# Patient Record
Sex: Male | Born: 1975 | Race: Black or African American | Hispanic: No | Marital: Married | Smoking: Current every day smoker
Health system: Southern US, Community
[De-identification: ages and names within clinical notes are randomized; demographics above are authoritative.]

## PROBLEM LIST (undated history)

## (undated) DIAGNOSIS — G4733 Obstructive sleep apnea (adult) (pediatric): Secondary | ICD-10-CM

## (undated) DIAGNOSIS — I10 Essential (primary) hypertension: Secondary | ICD-10-CM

## (undated) DIAGNOSIS — N200 Calculus of kidney: Secondary | ICD-10-CM

## (undated) DIAGNOSIS — R011 Cardiac murmur, unspecified: Secondary | ICD-10-CM

## (undated) HISTORY — DX: Calculus of kidney: N20.0

## (undated) HISTORY — DX: Cardiac murmur, unspecified: R01.1

## (undated) HISTORY — DX: Essential (primary) hypertension: I10

## (undated) HISTORY — DX: Obstructive sleep apnea (adult) (pediatric): G47.33

## (undated) HISTORY — PX: FRACTURE SURGERY: SHX138

---

## 2001-07-25 ENCOUNTER — Encounter: Payer: Self-pay | Admitting: Emergency Medicine

## 2001-07-25 ENCOUNTER — Emergency Department (HOSPITAL_COMMUNITY): Admission: EM | Admit: 2001-07-25 | Discharge: 2001-07-25 | Payer: Self-pay | Admitting: Emergency Medicine

## 2004-08-25 ENCOUNTER — Emergency Department (HOSPITAL_COMMUNITY): Admission: EM | Admit: 2004-08-25 | Discharge: 2004-08-25 | Payer: Self-pay | Admitting: Family Medicine

## 2004-09-24 ENCOUNTER — Emergency Department (HOSPITAL_COMMUNITY): Admission: EM | Admit: 2004-09-24 | Discharge: 2004-09-24 | Payer: Self-pay | Admitting: Emergency Medicine

## 2004-10-30 ENCOUNTER — Ambulatory Visit: Payer: Self-pay | Admitting: Internal Medicine

## 2004-12-01 ENCOUNTER — Ambulatory Visit: Payer: Self-pay | Admitting: Internal Medicine

## 2005-02-20 ENCOUNTER — Emergency Department (HOSPITAL_COMMUNITY): Admission: EM | Admit: 2005-02-20 | Discharge: 2005-02-20 | Payer: Self-pay | Admitting: Emergency Medicine

## 2005-12-29 ENCOUNTER — Emergency Department (HOSPITAL_COMMUNITY): Admission: EM | Admit: 2005-12-29 | Discharge: 2005-12-29 | Payer: Self-pay | Admitting: Family Medicine

## 2008-08-28 ENCOUNTER — Emergency Department (HOSPITAL_COMMUNITY): Admission: EM | Admit: 2008-08-28 | Discharge: 2008-08-28 | Payer: Self-pay | Admitting: Family Medicine

## 2009-04-15 ENCOUNTER — Emergency Department (HOSPITAL_BASED_OUTPATIENT_CLINIC_OR_DEPARTMENT_OTHER): Admission: EM | Admit: 2009-04-15 | Discharge: 2009-04-15 | Payer: Self-pay | Admitting: Emergency Medicine

## 2009-11-19 ENCOUNTER — Emergency Department (HOSPITAL_BASED_OUTPATIENT_CLINIC_OR_DEPARTMENT_OTHER): Admission: EM | Admit: 2009-11-19 | Discharge: 2009-11-19 | Payer: Self-pay | Admitting: Emergency Medicine

## 2009-11-19 ENCOUNTER — Ambulatory Visit: Payer: Self-pay | Admitting: Radiology

## 2010-07-18 LAB — URINE MICROSCOPIC-ADD ON

## 2010-07-18 LAB — URINALYSIS, ROUTINE W REFLEX MICROSCOPIC
Bilirubin Urine: NEGATIVE
Glucose, UA: NEGATIVE mg/dL
Ketones, ur: NEGATIVE mg/dL
Nitrite: NEGATIVE
Protein, ur: NEGATIVE mg/dL
Specific Gravity, Urine: 1.02 (ref 1.005–1.030)
Urobilinogen, UA: 0.2 mg/dL (ref 0.0–1.0)
pH: 6 (ref 5.0–8.0)

## 2010-08-12 LAB — STREP A DNA PROBE: Group A Strep Probe: NEGATIVE

## 2010-08-12 LAB — POCT RAPID STREP A (OFFICE): Streptococcus, Group A Screen (Direct): NEGATIVE

## 2010-08-14 ENCOUNTER — Emergency Department (HOSPITAL_BASED_OUTPATIENT_CLINIC_OR_DEPARTMENT_OTHER)
Admission: EM | Admit: 2010-08-14 | Discharge: 2010-08-14 | Disposition: A | Discharge status: Home / Self Care | Payer: BLUE CROSS/BLUE SHIELD | Attending: Emergency Medicine | Admitting: Emergency Medicine

## 2010-08-14 DIAGNOSIS — N200 Calculus of kidney: Secondary | ICD-10-CM | POA: Insufficient documentation

## 2010-08-14 DIAGNOSIS — R319 Hematuria, unspecified: Secondary | ICD-10-CM | POA: Insufficient documentation

## 2010-08-14 DIAGNOSIS — R109 Unspecified abdominal pain: Secondary | ICD-10-CM | POA: Insufficient documentation

## 2010-08-14 LAB — URINE MICROSCOPIC-ADD ON

## 2010-08-14 LAB — URINALYSIS, ROUTINE W REFLEX MICROSCOPIC
Bilirubin Urine: NEGATIVE
Glucose, UA: NEGATIVE mg/dL
Ketones, ur: 15 mg/dL — AB
Leukocytes, UA: NEGATIVE
Nitrite: NEGATIVE
Protein, ur: NEGATIVE mg/dL
Specific Gravity, Urine: 1.025 (ref 1.005–1.030)
Urobilinogen, UA: 0.2 mg/dL (ref 0.0–1.0)
pH: 6 (ref 5.0–8.0)

## 2013-10-18 ENCOUNTER — Ambulatory Visit (INDEPENDENT_AMBULATORY_CARE_PROVIDER_SITE_OTHER): Payer: BC Managed Care – PPO | Admitting: Family Medicine

## 2013-10-18 VITALS — BP 136/84 | HR 81 | Temp 99.0°F | Resp 20 | Ht 74.0 in | Wt 195.0 lb

## 2013-10-18 DIAGNOSIS — J029 Acute pharyngitis, unspecified: Secondary | ICD-10-CM

## 2013-10-18 LAB — POCT RAPID STREP A (OFFICE): Rapid Strep A Screen: NEGATIVE

## 2013-10-18 MED ORDER — AMOXICILLIN 875 MG PO TABS: 875.0000 mg | ORAL_TABLET | Freq: Two times a day (BID) | ORAL | Status: DC

## 2013-10-18 MED ORDER — HYDROCODONE-ACETAMINOPHEN 5-325 MG PO TABS: 1.0000 | ORAL_TABLET | ORAL | Status: DC | PRN

## 2013-10-18 NOTE — Progress Notes (Signed)
Subjective: 38 year old man who has had a terrible sore throat that started yesterday. He feels bad. He went to work today despite the pain. He has had a lot of strep in the past. He thinks may have had a little fever today.  Objective: TMs are normal. Throat erythematous with a lot of possible tonsils which are very swollen. Neck supple with anterior cervical nodes. Chest clear. Heart regular without murmurs. Temperature was minimally elevated at 99 here.  Assessment: Pharyngitis, suspicious for strep tonsillitis  Plan: Strep test and culture if needed  Results for orders placed in visit on 10/18/13  POCT RAPID STREP A (OFFICE)      Result Value Ref Range   Rapid Strep A Screen Negative  Negative

## 2013-10-18 NOTE — Patient Instructions (Signed)
Drink plenty of fluids and get enough rest  Take amoxicillin 875 one twice daily for 10 days  Take the hydrocodone pain pills every 4 hours if needed for severe pain  Take the ibuprofen 800 mg 3 times daily which will treat both pain and fever  Return if worse

## 2013-10-21 LAB — CULTURE, GROUP A STREP

## 2013-10-23 ENCOUNTER — Telehealth: Payer: Self-pay

## 2013-10-23 NOTE — Telephone Encounter (Signed)
After looking in patient's chart, called patient.  He said someone already called him back about his strep test.

## 2013-10-23 NOTE — Telephone Encounter (Signed)
PT STATES SOMEONE CALLED HIM AND HE DOESN'T KNOW WHY, DIDN'T LEAVE A MESSAGE PLEASE CALL [phone]

## 2013-12-03 ENCOUNTER — Ambulatory Visit (INDEPENDENT_AMBULATORY_CARE_PROVIDER_SITE_OTHER): Payer: BC Managed Care – PPO | Admitting: Emergency Medicine

## 2013-12-03 VITALS — BP 110/72 | HR 85 | Temp 97.6°F | Resp 16 | Ht 73.25 in | Wt 185.2 lb

## 2013-12-03 DIAGNOSIS — J36 Peritonsillar abscess: Secondary | ICD-10-CM

## 2013-12-03 DIAGNOSIS — J029 Acute pharyngitis, unspecified: Secondary | ICD-10-CM

## 2013-12-03 DIAGNOSIS — D72829 Elevated white blood cell count, unspecified: Secondary | ICD-10-CM

## 2013-12-03 LAB — POCT CBC
Granulocyte percent: 75 %G (ref 37–80)
HCT, POC: 45.1 % (ref 43.5–53.7)
Hemoglobin: 14.7 g/dL (ref 14.1–18.1)
Lymph, poc: 3.1 (ref 0.6–3.4)
MCH, POC: 29.9 pg (ref 27–31.2)
MCHC: 32.5 g/dL (ref 31.8–35.4)
MCV: 92 fL (ref 80–97)
MID (cbc): 13 — AB (ref 0–0.9)
MPV: 6.9 fL (ref 0–99.8)
POC Granulocyte: 13 — AB (ref 2–6.9)
POC LYMPH PERCENT: 18.2 %L (ref 10–50)
POC MID %: 6.8 %M (ref 0–12)
Platelet Count, POC: 342 10*3/uL (ref 142–424)
RBC: 4.9 M/uL (ref 4.69–6.13)
RDW, POC: 14.7 %
WBC: 17.3 10*3/uL — AB (ref 4.6–10.2)

## 2013-12-03 MED ORDER — AMOXICILLIN-POT CLAVULANATE 875-125 MG PO TABS: 1.0000 | ORAL_TABLET | Freq: Two times a day (BID) | ORAL | Status: DC

## 2013-12-03 MED ORDER — HYDROCODONE-ACETAMINOPHEN 7.5-325 MG/15ML PO SOLN: 10.0000 mL | Freq: Four times a day (QID) | ORAL | Status: DC | PRN

## 2013-12-03 NOTE — Progress Notes (Signed)
Subjective:    Patient ID: Eric Owens, male    DOB: 01-18-1976, 38 y.o.   MRN: [id number]  HPI Pt presents to clinic with return of his sore throat.  He feels like he is to blame because he only took about 3 days of his abx before he went out of town and forgot about it.  He throat was feeling better on the right side but about 4 days ago his throat starting hurting more now on the left.  He is able to swallow but his throat really hurts when he does not take ibuprofen.    Reviewed labs from las visit Results for orders placed in visit on 10/18/13  CULTURE, GROUP A STREP      Result Value Ref Range   Organism ID, Bacteria STREPTOCOCCUS BETA HEMOLYTIC NOT GROUP A       Review of Systems  Constitutional: Positive for fever. Negative for chills.  HENT: Positive for sore throat (on the left only). Negative for congestion.        Objective:   Physical Exam  Vitals reviewed. Constitutional: He is oriented to person, place, and time. He appears well-developed and well-nourished.  HENT:  Head: Normocephalic and atraumatic.  Right Ear: Hearing, tympanic membrane, external ear and ear canal normal.  Left Ear: Hearing, tympanic membrane, external ear and ear canal normal.  Nose: Nose normal.  Mouth/Throat: Oropharyngeal exudate, posterior oropharyngeal edema (ulceration on the left side of soft palate in middle of fluctuent area and TTP) and posterior oropharyngeal erythema present.  Cardiovascular: Normal rate, regular rhythm and normal heart sounds.   No murmur heard. Pulmonary/Chest: Effort normal and breath sounds normal. He has no wheezes.  Lymphadenopathy:       Head (right side): No tonsillar and no occipital adenopathy present.       Head (left side): No tonsillar and no occipital adenopathy present.    He has cervical adenopathy.       Right cervical: Superficial cervical (2+ and TTP) adenopathy present.       Left cervical: Superficial cervical (3+ and TTP) adenopathy  present.       Right: No inguinal adenopathy present.       Left: No inguinal adenopathy present.  Neurological: He is alert and oriented to person, place, and time.  Skin: Skin is warm and dry.  Psychiatric: He has a normal mood and affect. His behavior is normal. Judgment and thought content normal.   Results for orders placed in visit on 12/03/13  POCT CBC      Result Value Ref Range   WBC 17.3 (*) 4.6 - 10.2 K/uL   Lymph, poc 3.1  0.6 - 3.4   POC LYMPH PERCENT 18.2  10 - 50 %L   MID (cbc) 13.0 (*) 0 - 0.9   POC MID % 6.8  0 - 12 %M   POC Granulocyte 13.0 (*) 2 - 6.9   Granulocyte percent 75.0  37 - 80 %G   RBC 4.90  4.69 - 6.13 M/uL   Hemoglobin 14.7  14.1 - 18.1 g/dL   HCT, POC 40.945.1  81.143.5 - 53.7 %   MCV 92.0  80 - 97 fL   MCH, POC 29.9  27 - 31.2 pg   MCHC 32.5  31.8 - 35.4 g/dL   RDW, POC 91.414.7     Platelet Count, POC 342  142 - 424 K/uL   MPV 6.9  0 - 99.8 fL  Assessment & Plan:  Sore throat - Plan: POCT CBC, HYDROcodone-acetaminophen (HYCET) 7.5-325 mg/15 ml solution  Leukocytosis, unspecified - Plan: Ambulatory referral to ENT  Abscess, peritonsillar - Plan: Ambulatory referral to ENT, amoxicillin-clavulanate (AUGMENTIN) 875-125 MG per tablet  Due to elevated white count and ulceration making it appear that the abscess is ready to drain we will refer to ENT for possible I&D.    Patient Instructions  [facility] today at 2:40 pm ph# [phone] [address]    D/w Dr Edrick Kins PA-C  [facility] 12/03/2013 10:06 AM

## 2013-12-03 NOTE — Patient Instructions (Signed)
[facility] today at 2:40 pm ph# [phone] [address]

## 2014-09-17 ENCOUNTER — Ambulatory Visit (INDEPENDENT_AMBULATORY_CARE_PROVIDER_SITE_OTHER): Payer: BLUE CROSS/BLUE SHIELD | Admitting: Family

## 2014-09-17 ENCOUNTER — Encounter: Payer: Self-pay | Admitting: Family

## 2014-09-17 VITALS — BP 140/90 | HR 83 | Temp 97.9°F | Resp 18 | Ht 74.0 in | Wt 200.0 lb

## 2014-09-17 DIAGNOSIS — Z23 Encounter for immunization: Secondary | ICD-10-CM

## 2014-09-17 DIAGNOSIS — R0683 Snoring: Secondary | ICD-10-CM | POA: Diagnosis not present

## 2014-09-17 DIAGNOSIS — Z0000 Encounter for general adult medical examination without abnormal findings: Secondary | ICD-10-CM

## 2014-09-17 DIAGNOSIS — G4733 Obstructive sleep apnea (adult) (pediatric): Secondary | ICD-10-CM | POA: Insufficient documentation

## 2014-09-17 DIAGNOSIS — Z0001 Encounter for general adult medical examination with abnormal findings: Secondary | ICD-10-CM | POA: Insufficient documentation

## 2014-09-17 NOTE — Progress Notes (Signed)
Pre visit review using our clinic review tool, if applicable. No additional management support is needed unless otherwise documented below in the visit note. 

## 2014-09-17 NOTE — Assessment & Plan Note (Signed)
Symptoms of snoring and episodes of apnea consistent with potential sleep apnea. Schedule sleep study.

## 2014-09-17 NOTE — Progress Notes (Signed)
Subjective:    Patient ID: Eric Owens, male    DOB: 29-Jun-1975, 39 y.o.   MRN: [id number]  Chief Complaint  Patient presents with  . Establish Care    concerned about cholesterol,     HPI:  Eric Owens is a 39 y.o. male who presents today for an annual wellness visit.   1) Health Maintenance -   Diet - Averages 3 meals per day consisting of meats, fruits, vegetables; Caffeine intake is minimal  Exercise - 2-3x per week. Walks everyday at work.    2) Preventative Exams / Immunizations:  Dental -- Due for exam  Vision -- Due for exam   Health Maintenance  Topic Date Due  . HIV Screening  01/20/1993  . TETANUS/TDAP  01/20/1997  . INFLUENZA VACCINE  12/02/2014  Tetanus shot   There is no immunization history on file for this patient.   No Known Allergies   Outpatient Prescriptions Prior to Visit  Medication Sig Dispense Refill  . amoxicillin-clavulanate (AUGMENTIN) 875-125 MG per tablet Take 1 tablet by mouth 2 (two) times daily. 20 tablet 0  . HYDROcodone-acetaminophen (HYCET) 7.5-325 mg/15 ml solution Take 10 mLs by mouth every 6 (six) hours as needed for moderate pain. 120 mL 0  . ibuprofen (ADVIL,MOTRIN) 200 MG tablet Take 200 mg by mouth as needed.     No facility-administered medications prior to visit.    Past Medical History  Diagnosis Date  . Heart murmur     Childhood  . Kidney stones      Past Surgical History  Procedure Laterality Date  . Fracture surgery      Left Hand  . Fracture surgery      left hand     Family History  Problem Relation Age of Onset  . Heart disease Father   . Hypertension Father   . Hypertension Sister   . Heart disease Paternal Grandfather   . Hypertension Sister   . Hypertension Sister      History   Social History  . Marital Status: Married    Spouse Name: N/A  . Number of Children: N/A  . Years of Education: N/A   Occupational History  . Not on file.   Social History Main Topics  . Smoking  status: Current Every Day Smoker -- 1.00 packs/day for 10 years    Types: Cigarettes  . Smokeless tobacco: Never Used  . Alcohol Use: 1.2 - 1.8 oz/week    2-3 Shots of liquor per week  . Drug Use: No  . Sexual Activity: Not on file   Other Topics Concern  . Not on file   Social History Narrative   Review of Systems  Constitutional: Denies fever, chills, fatigue, or significant weight gain/loss. HENT: Head: Denies headache or neck pain Ears: Denies changes in hearing, ringing in ears, earache, drainage Nose: Denies discharge, stuffiness, itching, nosebleed, sinus pain Throat: Denies sore throat, hoarseness, dry mouth, sores, thrush Eyes: Denies loss/changes in vision, pain, redness, blurry/double vision, flashing lights Cardiovascular: Denies chest pain/discomfort, tightness, palpitations, shortness of breath with activity, difficulty lying down, swelling, sudden awakening with shortness of breath Respiratory: Denies shortness of breath, cough, sputum production, wheezing Positive for snoring which has been going on for several months. Indicates his wife states he stops breathing for periods of time.  Gastrointestinal: Denies dysphasia, heartburn, change in appetite, nausea, change in bowel habits, rectal bleeding, constipation, diarrhea, yellow skin or eyes Genitourinary: Denies frequency, urgency, burning/pain, blood in urine, incontinence, change  in urinary strength. Musculoskeletal: Denies muscle/joint pain, stiffness, back pain, redness or swelling of joints, trauma Skin: Denies rashes, lumps, itching, dryness, color changes, or hair/nail changes Neurological: Denies dizziness, fainting, seizures, weakness, numbness, tingling, tremor Psychiatric - Denies nervousness, stress, depression or memory loss Endocrine: Denies heat or cold intolerance, sweating, frequent urination, excessive thirst, changes in appetite Hematologic: Denies ease of bruising or bleeding     Objective:     BP 140/90 mmHg  Pulse 83  Temp(Src) 97.9 F (36.6 C) (Oral)  Resp 18  Ht 6\' 2"  (1.88 m)  Wt 200 lb (90.719 kg)  BMI 25.67 kg/m2  SpO2 97% Nursing note and vital signs reviewed.  Physical Exam  Constitutional: He is oriented to person, place, and time. He appears well-developed and well-nourished.  HENT:  Head: Normocephalic.  Right Ear: Hearing, tympanic membrane, external ear and ear canal normal.  Left Ear: Hearing, tympanic membrane, external ear and ear canal normal.  Nose: Nose normal.  Mouth/Throat: Uvula is midline, oropharynx is clear and moist and mucous membranes are normal.  Eyes: Conjunctivae and EOM are normal. Pupils are equal, round, and reactive to light.  Neck: Neck supple. No JVD present. No tracheal deviation present. No thyromegaly present.  Cardiovascular: Normal rate, regular rhythm, normal heart sounds and intact distal pulses.   Pulmonary/Chest: Effort normal and breath sounds normal.  Abdominal: Soft. Bowel sounds are normal. He exhibits no distension and no mass. There is no tenderness. There is no rebound and no guarding.  Musculoskeletal: Normal range of motion. He exhibits no edema or tenderness.  Lymphadenopathy:    He has no cervical adenopathy.  Neurological: He is alert and oriented to person, place, and time. He has normal reflexes. No cranial nerve deficit. He exhibits normal muscle tone. Coordination normal.  Skin: Skin is warm and dry.  Psychiatric: He has a normal mood and affect. His behavior is normal. Judgment and thought content normal.       Assessment & Plan:

## 2014-09-17 NOTE — Assessment & Plan Note (Signed)
1) Anticipatory Guidance: Discussed importance of wearing a seatbelt while driving and not texting while driving; changing batteries in smoke detector at least once annually; wearing suntan lotion when outside; eating a balanced and moderate diet; getting physical activity at least 30 minutes per day.  2) Immunizations / Screenings / Labs:  Tetanus updated today. All other immunizations are up-to-date per her recommendations. Patient is due for a dental and vision exam which he will schedule independently. All other screenings are up-to-date per recommendations. Obtain CBC, BMET, Lipid profile and TSH and fasting.   Overall well exam. Patient has cardiovascular risk factors including tobacco use and elevated blood pressure. Discussed tobacco cessation methods and risks and benefits to reach. Information provided and after summary. Patient will review the information and and creates a plan to execute. Blood pressure is elevated today and is the first reading of high blood pressure per patient. Continue to monitor. Continue other healthy lifestyle behaviors. Follow-up prevention exam in one year. Follow-up office visit pending lab work.

## 2014-09-17 NOTE — Progress Notes (Deleted)
   Subjective:    Patient ID: Eric Owens, male    DOB: 1975-08-12, 39 y.o.   MRN: [id number]  Chief Complaint  Patient presents with  . Establish Care    HPI:  Eric Owens is a 39 y.o. male with a PMH of *** who presents today ***   Review of Systems    Objective:    Ht 6\' 2"  (1.88 m) Nursing note and vital signs reviewed.  Physical Exam     Assessment & Plan:

## 2014-09-17 NOTE — Patient Instructions (Addendum)
Thank you for choosing [facility].  Summary/Instructions:  Please stop by the lab on the basement level of the building for your blood work. Your results will be released to MyChart (or called to you) after review, usually within 72 hours after test completion. If any changes need to be made, you will be notified at that same time.  Referrals have been made during this visit. You should expect to hear back from our schedulers in about 7-10 days in regards to establishing an appointment with the specialists we discussed.   If your symptoms worsen or fail to improve, please contact our office for further instruction, or in case of emergency go directly to the emergency room at the closest medical facility.      Health Maintenance A healthy lifestyle and preventative care can promote health and wellness.  Maintain regular health, dental, and eye exams.  Eat a healthy diet. Foods like vegetables, fruits, whole grains, low-fat dairy products, and lean protein foods contain the nutrients you need and are low in calories. Decrease your intake of foods high in solid fats, added sugars, and salt. Get information about a proper diet from your health care provider, if necessary.  Regular physical exercise is one of the most important things you can do for your health. Most adults should get at least 150 minutes of moderate-intensity exercise (any activity that increases your heart rate and causes you to sweat) each week. In addition, most adults need muscle-strengthening exercises on 2 or more days a week.   Maintain a healthy weight. The body mass index (BMI) is a screening tool to identify possible weight problems. It provides an estimate of body fat based on height and weight. Your health care provider can find your BMI and can help you achieve or maintain a healthy weight. For males 20 years and older:  A BMI below 18.5 is considered underweight.  A BMI of 18.5 to 24.9 is normal.  A BMI  of 25 to 29.9 is considered overweight.  A BMI of 30 and above is considered obese.  Maintain normal blood lipids and cholesterol by exercising and minimizing your intake of saturated fat. Eat a balanced diet with plenty of fruits and vegetables. Blood tests for lipids and cholesterol should begin at age 65 and be repeated every 5 years. If your lipid or cholesterol levels are high, you are over age 64, or you are at high risk for heart disease, you may need your cholesterol levels checked more frequently.Ongoing high lipid and cholesterol levels should be treated with medicines if diet and exercise are not working.  If you smoke, find out from your health care provider how to quit. If you do not use tobacco, do not start.  Lung cancer screening is recommended for adults aged 55-80 years who are at high risk for developing lung cancer because of a history of smoking. A yearly low-dose CT scan of the lungs is recommended for people who have at least a 30-pack-year history of smoking and are current smokers or have quit within the past 15 years. A pack year of smoking is smoking an average of 1 pack of cigarettes a day for 1 year (for example, a 30-pack-year history of smoking could mean smoking 1 pack a day for 30 years or 2 packs a day for 15 years). Yearly screening should continue until the smoker has stopped smoking for at least 15 years. Yearly screening should be stopped for people who develop a health problem that  would prevent them from having lung cancer treatment.  If you choose to drink alcohol, do not have more than 2 drinks per day. One drink is considered to be 12 oz (360 mL) of beer, 5 oz (150 mL) of wine, or 1.5 oz (45 mL) of liquor.  Avoid the use of street drugs. Do not share needles with anyone. Ask for help if you need support or instructions about stopping the use of drugs.  High blood pressure causes heart disease and increases the risk of stroke. Blood pressure should be checked  at least every 1-2 years. Ongoing high blood pressure should be treated with medicines if weight loss and exercise are not effective.  If you are 75-78 years old, ask your health care provider if you should take aspirin to prevent heart disease.  Diabetes screening involves taking a blood sample to check your fasting blood sugar level. This should be done once every 3 years after age 68 if you are at a normal weight and without risk factors for diabetes. Testing should be considered at a younger age or be carried out more frequently if you are overweight and have at least 1 risk factor for diabetes.  Colorectal cancer can be detected and often prevented. Most routine colorectal cancer screening begins at the age of 68 and continues through age 22. However, your health care provider may recommend screening at an earlier age if you have risk factors for colon cancer. On a yearly basis, your health care provider may provide home test kits to check for hidden blood in the stool. A small camera at the end of a tube may be used to directly examine the colon (sigmoidoscopy or colonoscopy) to detect the earliest forms of colorectal cancer. Talk to your health care provider about this at age 37 when routine screening begins. A direct exam of the colon should be repeated every 5-10 years through age 57, unless early forms of precancerous polyps or small growths are found.  People who are at an increased risk for hepatitis B should be screened for this virus. You are considered at high risk for hepatitis B if:  You were born in a country where hepatitis B occurs often. Talk with your health care provider about which countries are considered high risk.  Your parents were born in a high-risk country and you have not received a shot to protect against hepatitis B (hepatitis B vaccine).  You have HIV or AIDS.  You use needles to inject street drugs.  You live with, or have sex with, someone who has hepatitis  B.  You are a man who has sex with other men (MSM).  You get hemodialysis treatment.  You take certain medicines for conditions like cancer, organ transplantation, and autoimmune conditions.  Hepatitis C blood testing is recommended for all people born from [birth year] through 1963 and any individual with known risk factors for hepatitis C.  Healthy men should no longer receive prostate-specific antigen (PSA) blood tests as part of routine cancer screening. Talk to your health care provider about prostate cancer screening.  Testicular cancer screening is not recommended for adolescents or adult males who have no symptoms. Screening includes self-exam, a health care provider exam, and other screening tests. Consult with your health care provider about any symptoms you have or any concerns you have about testicular cancer.  Practice safe sex. Use condoms and avoid high-risk sexual practices to reduce the spread of sexually transmitted infections (STIs).  You should be screened  for STIs, including gonorrhea and chlamydia if:  You are sexually active and are younger than 24 years.  You are older than 24 years, and your health care provider tells you that you are at risk for this type of infection.  Your sexual activity has changed since you were last screened, and you are at an increased risk for chlamydia or gonorrhea. Ask your health care provider if you are at risk.  If you are at risk of being infected with HIV, it is recommended that you take a prescription medicine daily to prevent HIV infection. This is called pre-exposure prophylaxis (PrEP). You are considered at risk if:  You are a man who has sex with other men (MSM).  You are a heterosexual man who is sexually active with multiple partners.  You take drugs by injection.  You are sexually active with a partner who has HIV.  Talk with your health care provider about whether you are at high risk of being infected with HIV. If you  choose to begin PrEP, you should first be tested for HIV. You should then be tested every 3 months for as long as you are taking PrEP.  Use sunscreen. Apply sunscreen liberally and repeatedly throughout the day. You should seek shade when your shadow is shorter than you. Protect yourself by wearing long sleeves, pants, a wide-brimmed hat, and sunglasses year round whenever you are outdoors.  Tell your health care provider of new moles or changes in moles, especially if there is a change in shape or color. Also, tell your health care provider if a mole is larger than the size of a pencil eraser.  A one-time screening for abdominal aortic aneurysm (AAA) and surgical repair of large AAAs by ultrasound is recommended for men aged 65-75 years who are current or former smokers.  Stay current with your vaccines (immunizations). Document Released: 10/16/2007 Document Revised: 04/24/2013 Document Reviewed: 09/14/2010 [facility] Patient Information 2015 [place], [place]. This information is not intended to replace advice given to you by your health care provider. Make sure you discuss any questions you have with your health care provider.   Smoking Cessation Quitting smoking is important to your health and has many advantages. However, it is not always easy to quit since nicotine is a very addictive drug. Oftentimes, people try 3 times or more before being able to quit. This document explains the best ways for you to prepare to quit smoking. Quitting takes hard work and a lot of effort, but you can do it. ADVANTAGES OF QUITTING SMOKING  You will live longer, feel better, and live better.  Your body will feel the impact of quitting smoking almost immediately.  Within 20 minutes, blood pressure decreases. Your pulse returns to its normal level.  After 8 hours, carbon monoxide levels in the blood return to normal. Your oxygen level increases.  After 24 hours, the chance of having a heart attack starts to  decrease. Your breath, hair, and body stop smelling like smoke.  After 48 hours, damaged nerve endings begin to recover. Your sense of taste and smell improve.  After 72 hours, the body is virtually free of nicotine. Your bronchial tubes relax and breathing becomes easier.  After 2 to 12 weeks, lungs can hold more air. Exercise becomes easier and circulation improves.  The risk of having a heart attack, stroke, cancer, or lung disease is greatly reduced.  After 1 year, the risk of coronary heart disease is cut in half.  After 5  years, the risk of stroke falls to the same as a nonsmoker.  After 10 years, the risk of lung cancer is cut in half and the risk of other cancers decreases significantly.  After 15 years, the risk of coronary heart disease drops, usually to the level of a nonsmoker.  If you are pregnant, quitting smoking will improve your chances of having a healthy baby.  The people you live with, especially any children, will be healthier.  You will have extra money to spend on things other than cigarettes. QUESTIONS TO THINK ABOUT BEFORE ATTEMPTING TO QUIT You may want to talk about your answers with your health care provider.  Why do you want to quit?  If you tried to quit in the past, what helped and what did not?  What will be the most difficult situations for you after you quit? How will you plan to handle them?  Who can help you through the tough times? Your family? Friends? A health care provider?  What pleasures do you get from smoking? What ways can you still get pleasure if you quit? Here are some questions to ask your health care provider:  How can you help me to be successful at quitting?  What medicine do you think would be best for me and how should I take it?  What should I do if I need more help?  What is smoking withdrawal like? How can I get information on withdrawal? GET READY  Set a quit date.  Change your environment by getting rid of all  cigarettes, ashtrays, matches, and lighters in your home, car, or work. Do not let people smoke in your home.  Review your past attempts to quit. Think about what worked and what did not. GET SUPPORT AND ENCOURAGEMENT You have a better chance of being successful if you have help. You can get support in many ways.  Tell your family, friends, and coworkers that you are going to quit and need their support. Ask them not to smoke around you.  Get individual, group, or telephone counseling and support. Programs are available at [employer] and health centers. Call your local health department for information about programs in your area.  Spiritual beliefs and practices may help some smokers quit.  Download a "quit meter" on your computer to keep track of quit statistics, such as how long you have gone without smoking, cigarettes not smoked, and money saved.  Get a self-help book about quitting smoking and staying off tobacco. LEARN NEW SKILLS AND BEHAVIORS  Distract yourself from urges to smoke. Talk to someone, go for a walk, or occupy your time with a task.  Change your normal routine. Take a different route to work. Drink tea instead of coffee. Eat breakfast in a different place.  Reduce your stress. Take a hot bath, exercise, or read a book.  Plan something enjoyable to do every day. Reward yourself for not smoking.  Explore interactive web-based programs that specialize in helping you quit. GET MEDICINE AND USE IT CORRECTLY Medicines can help you stop smoking and decrease the urge to smoke. Combining medicine with the above behavioral methods and support can greatly increase your chances of successfully quitting smoking.  Nicotine replacement therapy helps deliver nicotine to your body without the negative effects and risks of smoking. Nicotine replacement therapy includes nicotine gum, lozenges, inhalers, nasal sprays, and skin patches. Some may be available over-the-counter and  others require a prescription.  Antidepressant medicine helps people abstain from smoking,  but how this works is unknown. This medicine is available by prescription.  Nicotinic receptor partial agonist medicine simulates the effect of nicotine in your brain. This medicine is available by prescription. Ask your health care provider for advice about which medicines to use and how to use them based on your health history. Your health care provider will tell you what side effects to look out for if you choose to be on a medicine or therapy. Carefully read the information on the package. Do not use any other product containing nicotine while using a nicotine replacement product.  RELAPSE OR DIFFICULT SITUATIONS Most relapses occur within the first 3 months after quitting. Do not be discouraged if you start smoking again. Remember, most people try several times before finally quitting. You may have symptoms of withdrawal because your body is used to nicotine. You may crave cigarettes, be irritable, feel very hungry, cough often, get headaches, or have difficulty concentrating. The withdrawal symptoms are only temporary. They are strongest when you first quit, but they will go away within 10-14 days. To reduce the chances of relapse, try to:  Avoid drinking alcohol. Drinking lowers your chances of successfully quitting.  Reduce the amount of caffeine you consume. Once you quit smoking, the amount of caffeine in your body increases and can give you symptoms, such as a rapid heartbeat, sweating, and anxiety.  Avoid smokers because they can make you want to smoke.  Do not let weight gain distract you. Many smokers will gain weight when they quit, usually less than 10 pounds. Eat a healthy diet and stay active. You can always lose the weight gained after you quit.  Find ways to improve your mood other than smoking. FOR MORE INFORMATION  www.smokefree.gov  Document Released: 04/13/2001 Document Revised:  09/03/2013 Document Reviewed: 07/29/2011 [facility] Patient Information 2015 [place], [place]. This information is not intended to replace advice given to you by your health care provider. Make sure you discuss any questions you have with your health care provider.  Varenicline oral tablets What is this medicine? VARENICLINE (var EN i kleen) is used to help people quit smoking. It can reduce the symptoms caused by stopping smoking. It is used with a patient support program recommended by your physician. This medicine may be used for other purposes; ask your health care provider or pharmacist if you have questions. COMMON BRAND NAME(S): Chantix What should I tell my health care provider before I take this medicine? They need to know if you have any of these conditions: -bipolar disorder, depression, schizophrenia or other mental illness -heart disease -if you often drink alcohol -kidney disease -peripheral vascular disease -seizures -stroke -suicidal thoughts, plans, or attempt; a previous suicide attempt by you or a family member -an unusual or allergic reaction to varenicline, other medicines, foods, dyes, or preservatives -pregnant or trying to get pregnant -breast-feeding How should I use this medicine? You should set a date to stop smoking and tell your doctor. Start this medicine one week before the quit date. You can also start taking this medicine before you choose a quit date, and then pick a quit date that is between 8 and 35 days of treatment with this medicine. Stick to your plan; ask about support groups or other ways to help you remain a 'quitter'. Take this medicine by mouth after eating. Take with a full glass of water. Follow the directions on the prescription label. Take your doses at regular intervals. Do not take your medicine more often than  directed. A special MedGuide will be given to you by the pharmacist with each prescription and refill. Be sure to read this information  carefully each time. Talk to your pediatrician regarding the use of this medicine in children. This medicine is not approved for use in children. Overdosage: If you think you have taken too much of this medicine contact a poison control center or emergency room at once. NOTE: This medicine is only for you. Do not share this medicine with others. What if I miss a dose? If you miss a dose, take it as soon as you can. If it is almost time for your next dose, take only that dose. Do not take double or extra doses. What may interact with this medicine? -alcohol or any product that contains alcohol -insulin -other stop smoking aids -theophylline -warfarin This list may not describe all possible interactions. Give your health care provider a list of all the medicines, herbs, non-prescription drugs, or dietary supplements you use. Also tell them if you smoke, drink alcohol, or use illegal drugs. Some items may interact with your medicine. What should I watch for while using this medicine? Visit your doctor or health care professional for regular check ups. Ask for ongoing advice and encouragement from your doctor or healthcare professional, friends, and family to help you quit. If you smoke while on this medication, quit again Your mouth may get dry. Chewing sugarless gum or sucking hard candy, and drinking plenty of water may help. Contact your doctor if the problem does not go away or is severe. You may get drowsy or dizzy. Do not drive, use machinery, or do anything that needs mental alertness until you know how this medicine affects you. Do not stand or sit up quickly, especially if you are an older patient. This reduces the risk of dizzy or fainting spells. The use of this medicine may increase the chance of suicidal thoughts or actions. Pay special attention to how you are responding while on this medicine. Any worsening of mood, or thoughts of suicide or dying should be reported to your health care  professional right away. What side effects may I notice from receiving this medicine? Side effects that you should report to your doctor or health care professional as soon as possible: -allergic reactions like skin rash, itching or hives, swelling of the face, lips, tongue, or throat -breathing problems -changes in vision -chest pain or chest tightness -confusion, trouble speaking or understanding -fast, irregular heartbeat -feeling faint or lightheaded, falls -fever -pain in legs when walking -problems with balance, talking, walking -redness, blistering, peeling or loosening of the skin, including inside the mouth -ringing in ears -seizures -sudden numbness or weakness of the face, arm or leg -suicidal thoughts or other mood changes -trouble passing urine or change in the amount of urine -unusual bleeding or bruising -unusually weak or tired Side effects that usually do not require medical attention (report to your doctor or health care professional if they continue or are bothersome): -constipation -headache -nausea, vomiting -strange dreams -stomach gas -trouble sleeping This list may not describe all possible side effects. Call your doctor for medical advice about side effects. You may report side effects to FDA at 1-800-FDA-1088. Where should I keep my medicine? Keep out of the reach of children. Store at room temperature between 15 and 30 degrees C (59 and 86 degrees F). Throw away any unused medicine after the expiration date. NOTE: This sheet is a summary. It may not cover all possible  information. If you have questions about this medicine, talk to your doctor, pharmacist, or health care provider.  2015, [employer]. (2013-01-29 13:37:47) Bupropion sustained-release tablets (smoking cessation) What is this medicine? BUPROPION (byoo PROE pee on) is used to help people quit smoking. This medicine may be used for other purposes; ask your health care provider or  pharmacist if you have questions. COMMON BRAND NAME(S): Buproban, Zyban What should I tell my health care provider before I take this medicine? They need to know if you have any of these conditions: -an eating disorder, such as anorexia or bulimia -bipolar disorder or psychosis -diabetes or high blood sugar, treated with medication -glaucoma -head injury or brain tumor -heart disease, previous heart attack, or irregular heart beat -high blood pressure -kidney or liver disease -seizures -suicidal thoughts or a previous suicide attempt -Tourette's syndrome -weight loss -an unusual or allergic reaction to bupropion, other medicines, foods, dyes, or preservatives -breast-feeding -pregnant or trying to become pregnant How should I use this medicine? Take this medicine by mouth with a glass of water. Follow the directions on the prescription label. You can take it with or without food. If it upsets your stomach, take it with food. Do not cut, crush or chew this medicine. Take your medicine at regular intervals. If you take this medicine more than once a day, take your second dose at least 8 hours after you take your first dose. To limit difficulty in sleeping, avoid taking this medicine at bedtime. Do not take your medicine more often than directed. Do not stop taking this medicine suddenly except upon the advice of your doctor. Stopping this medicine too quickly may cause serious side effects. A special MedGuide will be given to you by the pharmacist with each prescription and refill. Be sure to read this information carefully each time. Talk to your pediatrician regarding the use of this medicine in children. Special care may be needed. Overdosage: If you think you have taken too much of this medicine contact a poison control center or emergency room at once. NOTE: This medicine is only for you. Do not share this medicine with others. What if I miss a dose? If you miss a dose, skip the missed  dose and take your next tablet at the regular time. There should be at least 8 hours between doses. Do not take double or extra doses. What may interact with this medicine? Do not take this medicine with any of the following medications: -linezolid -MAOIs like Azilect, Carbex, Eldepryl, Marplan, Nardil, and Parnate -methylene blue (injected into a vein) -other medicines that contain bupropion like Wellbutrin This medicine may also interact with the following medications: -alcohol -certain medicines for anxiety or sleep -certain medicines for blood pressure like metoprolol, propranolol -certain medicines for depression or psychotic disturbances -certain medicines for HIV or AIDS like efavirenz, lopinavir, nelfinavir, ritonavir -certain medicines for irregular heart beat like propafenone, flecainide -certain medicines for Parkinson's disease like amantadine, levodopa -certain medicines for seizures like carbamazepine, phenytoin, phenobarbital -cimetidine -clopidogrel -cyclophosphamide -furazolidone -isoniazid -nicotine -orphenadrine -procarbazine -steroid medicines like prednisone or cortisone -stimulant medicines for attention disorders, weight loss, or to stay awake -tamoxifen -theophylline -thiotepa -ticlopidine -tramadol -warfarin This list may not describe all possible interactions. Give your health care provider a list of all the medicines, herbs, non-prescription drugs, or dietary supplements you use. Also tell them if you smoke, drink alcohol, or use illegal drugs. Some items may interact with your medicine. What should I watch for while using this  medicine? Visit your doctor or health care professional for regular checks on your progress. This medicine should be used together with a patient support program. It is important to participate in a behavioral program, counseling, or other support program that is recommended by your health care professional. Patients and their  families should watch out for new or worsening thoughts of suicide or depression. Also watch out for sudden changes in feelings such as feeling anxious, agitated, panicky, irritable, hostile, aggressive, impulsive, severely restless, overly excited and hyperactive, or not being able to sleep. If this happens, especially at the beginning of treatment or after a change in dose, call your health care professional. Avoid alcoholic drinks while taking this medicine. Drinking excessive alcoholic beverages, using sleeping or anxiety medicines, or quickly stopping the use of these agents while taking this medicine may increase your risk for a seizure. Do not drive or use heavy machinery until you know how this medicine affects you. This medicine can impair your ability to perform these tasks. Do not take this medicine close to bedtime. It may prevent you from sleeping. Your mouth may get dry. Chewing sugarless gum or sucking hard candy, and drinking plenty of water may help. Contact your doctor if the problem does not go away or is severe. Do not use nicotine patches or chewing gum without the advice of your doctor or health care professional while taking this medicine. You may need to have your blood pressure taken regularly if your doctor recommends that you use both nicotine and this medicine together. What side effects may I notice from receiving this medicine? Side effects that you should report to your doctor or health care professional as soon as possible: -allergic reactions like skin rash, itching or hives, swelling of the face, lips, or tongue -breathing problems -changes in vision -confusion -fast or irregular heartbeat -hallucinations -increased blood pressure -redness, blistering, peeling or loosening of the skin, including inside the mouth -seizures -suicidal thoughts or other mood changes -unusually weak or tired -vomiting Side effects that usually do not require medical attention (report  to your doctor or health care professional if they continue or are bothersome): -change in sex drive or performance -constipation -headache -loss of appetite -nausea -tremors -weight loss This list may not describe all possible side effects. Call your doctor for medical advice about side effects. You may report side effects to FDA at 1-800-FDA-1088. Where should I keep my medicine? Keep out of the reach of children. Store at room temperature between 20 and 25 degrees C (68 and 77 degrees F). Protect from light. Keep container tightly closed. Throw away any unused medicine after the expiration date. NOTE: This sheet is a summary. It may not cover all possible information. If you have questions about this medicine, talk to your doctor, pharmacist, or health care provider.  2015, Elsevier/Gold Standard. (2012-12-15 10:55:10)

## 2015-01-23 ENCOUNTER — Encounter: Payer: Self-pay | Admitting: Family

## 2015-01-23 ENCOUNTER — Ambulatory Visit (INDEPENDENT_AMBULATORY_CARE_PROVIDER_SITE_OTHER): Payer: BLUE CROSS/BLUE SHIELD | Admitting: Family

## 2015-01-23 VITALS — BP 168/120 | HR 80 | Temp 98.2°F | Resp 18 | Ht 74.0 in | Wt 204.0 lb

## 2015-01-23 DIAGNOSIS — I10 Essential (primary) hypertension: Secondary | ICD-10-CM | POA: Diagnosis not present

## 2015-01-23 MED ORDER — HYDROCHLOROTHIAZIDE 25 MG PO TABS: 25.0000 mg | ORAL_TABLET | Freq: Every day | ORAL | Status: DC

## 2015-01-23 NOTE — Assessment & Plan Note (Signed)
Previously noted to have elevated blood pressure and now confirmed hypertension which is most likely the cause of his current headaches. Denies symptoms of end organ damage. Start hydrochlorothiazide. Monitor blood pressure at home. Follow DASH eating plan. Recheck blood pressure in 2 weeks.

## 2015-01-23 NOTE — Patient Instructions (Addendum)
Thank you for choosing [facility].  Summary/Instructions:  Your prescription(s) have been submitted to your pharmacy or been printed and provided for you. Please take as directed and contact our office if you believe you are having problem(s) with the medication(s) or have any questions.  If your symptoms worsen or fail to improve, please contact our office for further instruction, or in case of emergency go directly to the emergency room at the closest medical facility.    Hydrochlorothiazide, HCTZ capsules or tablets What is this medicine? HYDROCHLOROTHIAZIDE (hye droe klor oh THYE a zide) is a diuretic. It increases the amount of urine passed, which causes the body to lose salt and water. This medicine is used to treat high blood pressure. It is also reduces the swelling and water retention caused by various medical conditions, such as heart, liver, or kidney disease. This medicine may be used for other purposes; ask your health care provider or pharmacist if you have questions. COMMON BRAND NAME(S): Esidrix, Ezide, HydroDIURIL, Microzide, Oretic, Zide What should I tell my health care provider before I take this medicine? They need to know if you have any of these conditions: -diabetes -gout -immune system problems, like lupus -kidney disease or kidney stones -liver disease -pancreatitis -small amount of urine or difficulty passing urine -an unusual or allergic reaction to hydrochlorothiazide, sulfa drugs, other medicines, foods, dyes, or preservatives -pregnant or trying to get pregnant -breast-feeding How should I use this medicine? Take this medicine by mouth with a glass of water. Follow the directions on the prescription label. Take your medicine at regular intervals. Remember that you will need to pass urine frequently after taking this medicine. Do not take your doses at a time of day that will cause you problems. Do not stop taking your medicine unless your doctor tells  you to. Talk to your pediatrician regarding the use of this medicine in children. Special care may be needed. Overdosage: If you think you have taken too much of this medicine contact a poison control center or emergency room at once. NOTE: This medicine is only for you. Do not share this medicine with others. What if I miss a dose? If you miss a dose, take it as soon as you can. If it is almost time for your next dose, take only that dose. Do not take double or extra doses. What may interact with this medicine? -cholestyramine -colestipol -digoxin -dofetilide -lithium -medicines for blood pressure -medicines for diabetes -medicines that relax muscles for surgery -other diuretics -steroid medicines like prednisone or cortisone This list may not describe all possible interactions. Give your health care provider a list of all the medicines, herbs, non-prescription drugs, or dietary supplements you use. Also tell them if you smoke, drink alcohol, or use illegal drugs. Some items may interact with your medicine. What should I watch for while using this medicine? Visit your doctor or health care professional for regular checks on your progress. Check your blood pressure as directed. Ask your doctor or health care professional what your blood pressure should be and when you should contact him or her. You may need to be on a special diet while taking this medicine. Ask your doctor. Check with your doctor or health care professional if you get an attack of severe diarrhea, nausea and vomiting, or if you sweat a lot. The loss of too much body fluid can make it dangerous for you to take this medicine. You may get drowsy or dizzy. Do not drive, use  or do anything that needs mental alertness until you know how this medicine affects you. Do not stand or sit up quickly, especially if you are an older patient. This reduces the risk of dizzy or fainting spells. Alcohol may interfere with the effect  of this medicine. Avoid alcoholic drinks. This medicine may affect your blood sugar level. If you have diabetes, check with your doctor or health care professional before changing the dose of your diabetic medicine. This medicine can make you more sensitive to the sun. Keep out of the sun. If you cannot avoid being in the sun, wear protective clothing and use sunscreen. Do not use sun lamps or tanning beds/booths. What side effects may I notice from receiving this medicine? Side effects that you should report to your doctor or health care professional as soon as possible: -allergic reactions such as skin rash or itching, hives, swelling of the lips, mouth, tongue, or throat -changes in vision -chest pain -eye pain -fast or irregular heartbeat -feeling faint or lightheaded, falls -gout attack -muscle pain or cramps -pain or difficulty when passing urine -pain, tingling, numbness in the hands or feet -redness, blistering, peeling or loosening of the skin, including inside the mouth -unusually weak or tired Side effects that usually do not require medical attention (report to your doctor or health care professional if they continue or are bothersome): -change in sex drive or performance -dry mouth -headache -stomach upset This list may not describe all possible side effects. Call your doctor for medical advice about side effects. You may report side effects to FDA at 1-800-FDA-1088. Where should I keep my medicine? Keep out of the reach of children. Store at room temperature between 15 and 30 degrees C (59 and 86 degrees F). Do not freeze. Protect from light and moisture. Keep container closed tightly. Throw away any unused medicine after the expiration date. NOTE: This sheet is a summary. It may not cover all possible information. If you have questions about this medicine, talk to your doctor, pharmacist, or health care provider.  2015, Elsevier/Gold Standard. (2009-12-12 12:57:37)  

## 2015-01-23 NOTE — Progress Notes (Signed)
Pre visit review using our clinic review tool, if applicable. No additional management support is needed unless otherwise documented below in the visit note. 

## 2015-01-23 NOTE — Progress Notes (Signed)
   Subjective:    Patient ID: Eric Owens, male    DOB: 03/02/1976, 39 y.o.   MRN: [id number]  Chief Complaint  Patient presents with  . Headache    x1 month has been having issues with headaches, states that he checked it yesterday and it was high at 158/115    HPI:  Eric Owens is a 39 y.o. male who  has a past medical history of Heart murmur and Kidney stones. and presents today for an acute office visit.   1.) Hypertension/Headache- Associated symptom of headaches located on the right side of his head has been going on for about a month. Modifying factors include ibuprofen which helps it go away. Pain is described as dull and sometimes sinus related with a severity being around a 3/10. Not currently taking any medication for his blood pressure and has noted that his blood pressure has been elevated recently. Notes that a blood pressure at home was 158/115.  No Known Allergies   No current outpatient prescriptions on file prior to visit.   No current facility-administered medications on file prior to visit.    Past Medical History  Diagnosis Date  . Heart murmur     Childhood  . Kidney stones     Review of Systems  Constitutional: Negative for fever and chills.  Eyes:       Negative for changes in vision.   Respiratory: Negative for chest tightness and shortness of breath.   Cardiovascular: Negative for chest pain, palpitations and leg swelling.  Neurological: Positive for headaches.      Objective:    BP 168/120 mmHg  Pulse 80  Temp(Src) 98.2 F (36.8 C) (Oral)  Resp 18  Ht  (1.88 m)  Wt 204 lb (92.534 kg)  BMI 26.18 kg/m2  SpO2 99% Nursing note and vital signs reviewed.  Physical Exam  Constitutional: He is oriented to person, place, and time. He appears well-developed and well-nourished. No distress.  Eyes: Conjunctivae and EOM are normal. Pupils are equal, round, and reactive to light.  Cardiovascular: Normal rate, regular rhythm, normal heart sounds  and intact distal pulses.   Pulmonary/Chest: Effort normal and breath sounds normal.  Neurological: He is alert and oriented to person, place, and time. He has normal reflexes.  Skin: Skin is warm and dry.  Psychiatric: He has a normal mood and affect. His behavior is normal. Judgment and thought content normal.       Assessment & Plan:   Problem List Items Addressed This Visit      Cardiovascular and Mediastinum   Essential hypertension - Primary    Previously noted to have elevated blood pressure and now confirmed hypertension which is most likely the cause of his current headaches. Denies symptoms of end organ damage. Start hydrochlorothiazide. Monitor blood pressure at home. Follow DASH eating plan. Recheck blood pressure in 2 weeks.       Relevant Medications   hydrochlorothiazide (HYDRODIURIL) 25 MG tablet

## 2015-02-06 ENCOUNTER — Ambulatory Visit (HOSPITAL_BASED_OUTPATIENT_CLINIC_OR_DEPARTMENT_OTHER): Payer: BLUE CROSS/BLUE SHIELD | Attending: Family | Admitting: *Deleted

## 2015-02-06 DIAGNOSIS — G4733 Obstructive sleep apnea (adult) (pediatric): Secondary | ICD-10-CM | POA: Insufficient documentation

## 2015-02-06 DIAGNOSIS — R0683 Snoring: Secondary | ICD-10-CM | POA: Diagnosis present

## 2015-02-08 DIAGNOSIS — R0683 Snoring: Secondary | ICD-10-CM | POA: Diagnosis not present

## 2015-02-08 NOTE — Progress Notes (Signed)
   Patient Name: Eric Owens, Eric Owens Date: 02/06/2015 Gender: Male D.O.B: 1975/11/18 Age (years): 39 Referring Provider: Veryl Speak Height (inches): 74 Interpreting Physician: Jetty Duhamel MD, ABSM Weight (lbs): 197 RPSGT: Elaina Pattee BMI: 25 MRN: [id number] Neck Size: 15.50 CLINICAL INFORMATION Sleep Study Type: NPSG  Indication for sleep study: Snoring, Witnesses Apnea / Gasping During Sleep  Epworth Sleepiness Score: 19  SLEEP STUDY TECHNIQUE As per the AASM Manual for the Scoring of Sleep and Associated Events v2.3 (April 2016) with a hypopnea requiring 4% desaturations.  The channels recorded and monitored were frontal, central and occipital EEG, electrooculogram (EOG), submentalis EMG (chin), nasal and oral airflow, thoracic and abdominal wall motion, anterior tibialis EMG, snore microphone, electrocardiogram, and pulse oximetry.  MEDICATIONS Patient's medications include: charted for review. Medications self-administered by patient during sleep study : No sleep medicine administered.  SLEEP ARCHITECTURE The study was initiated at 10:49:07 PM and ended at 4:49:54 AM.  Sleep onset time was 5.2 minutes and the sleep efficiency was 92.5%. The total sleep time was 333.6 minutes.  Stage REM latency was 94.5 minutes.  The patient spent 8.69% of the night in stage N1 sleep, 68.38% in stage N2 sleep, 0.00% in stage N3 and 22.93% in REM.  Alpha intrusion was absent.  Supine sleep was 40.68%.  Wake after sleep onset 22 minutes  RESPIRATORY PARAMETERS The overall apnea/hypopnea index (AHI) was 86.0 per hour. There were 167 total apneas, including 166 obstructive, 1 central and 0 mixed apneas. There were 311 hypopneas and 3 RERAs.  The AHI during Stage REM sleep was 82.4 per hour.  AHI while supine was 87.5 per hour.  The mean oxygen saturation was 91.45%. The minimum SpO2 during sleep was 79.00%.  Moderate snoring was noted during this study.  CARDIAC  DATA The 2 lead EKG demonstrated sinus rhythm. The mean heart rate was 73.91 beats per minute. Other EKG findings include: None.  LEG MOVEMENT DATA The total PLMS were 0 with a resulting PLMS index of 0.00. Associated arousal with leg movement index was 0.0 .  IMPRESSIONS Severe obstructive sleep apnea occurred during this study (AHI = 86.0/h). The study was ordered as a diagnostic polysomnogram without CPAP titration No significant central sleep apnea occurred during this study (CAI = 0.2/h). Moderate oxygen desaturation was noted during this study (Min O2 = 79.00%). The patient snored with Moderate snoring volume. No cardiac abnormalities were noted during this study. Clinically significant periodic limb movements did not occur during sleep. No significant associated arousals.   DIAGNOSIS Obstructive Sleep Apnea (327.23 [G47.33 ICD-10])   RECOMMENDATIONS Therapeutic CPAP titration to determine optimal pressure required to alleviate sleep disordered breathing. Avoid alcohol, sedatives and other CNS depressants that may worsen sleep apnea and disrupt normal sleep architecture. Sleep hygiene should be reviewed to assess factors that may improve sleep quality. Weight management and regular exercise should be initiated or continued if appropriate.  Waymon Budge Diplomate, American Board of Sleep Medicine  ELECTRONICALLY SIGNED ON:  02/08/2015, 1:38 PM [facility] PH: [phone]   FX: [phone] ACCREDITED BY THE AMERICAN ACADEMY OF SLEEP MEDICINE

## 2015-02-24 ENCOUNTER — Telehealth: Payer: Self-pay | Admitting: Family

## 2015-02-27 NOTE — Telephone Encounter (Signed)
Pt wife called in and said that pt needs refill on bp meds asap.  He has been out since Monday  [facility] on file

## 2015-02-27 NOTE — Telephone Encounter (Signed)
Done

## 2015-05-13 ENCOUNTER — Telehealth: Payer: Self-pay | Admitting: Family

## 2015-05-13 DIAGNOSIS — G4733 Obstructive sleep apnea (adult) (pediatric): Secondary | ICD-10-CM

## 2015-05-13 NOTE — Telephone Encounter (Signed)
Pt stated he had a sleep study back in 02/2015 and he suppose to hear something from our office concern about CPAP. Please check and call pt back  Phone # [phone]

## 2015-05-13 NOTE — Telephone Encounter (Signed)
Have you heard anything about this? Please advise

## 2015-05-13 NOTE — Telephone Encounter (Signed)
He needs to follow up with pulmonology - I have placed a referral to be sure.

## 2015-05-14 NOTE — Telephone Encounter (Signed)
Advised patient of greg calones note, patient states someone has already called him to make appt

## 2015-07-15 ENCOUNTER — Encounter: Payer: Self-pay | Admitting: Pulmonary Disease

## 2015-07-15 ENCOUNTER — Ambulatory Visit (INDEPENDENT_AMBULATORY_CARE_PROVIDER_SITE_OTHER): Payer: BLUE CROSS/BLUE SHIELD | Admitting: Pulmonary Disease

## 2015-07-15 VITALS — BP 144/82 | HR 97 | Ht 74.5 in | Wt 204.0 lb

## 2015-07-15 DIAGNOSIS — G4733 Obstructive sleep apnea (adult) (pediatric): Secondary | ICD-10-CM | POA: Diagnosis not present

## 2015-07-15 NOTE — Progress Notes (Signed)
   Subjective:    Patient ID: Eric Owens, male    DOB: 12-10-1975, 40 y.o.   MRN: [id number]  HPI    Review of Systems  Constitutional: Negative for fever and unexpected weight change.  HENT: Negative for congestion, dental problem, ear pain, nosebleeds, postnasal drip, rhinorrhea, sinus pressure, sneezing, sore throat and trouble swallowing.   Eyes: Negative for redness and itching.  Respiratory: Negative for cough, chest tightness, shortness of breath and wheezing.   Cardiovascular: Negative for palpitations and leg swelling.  Gastrointestinal: Negative for nausea and vomiting.  Genitourinary: Negative for dysuria.  Musculoskeletal: Negative for joint swelling.  Skin: Negative for rash.  Neurological: Negative for headaches.  Hematological: Does not bruise/bleed easily.  Psychiatric/Behavioral: Negative for dysphoric mood. The patient is not nervous/anxious.        Objective:   Physical Exam        Assessment & Plan:

## 2015-07-15 NOTE — Progress Notes (Signed)
Past Medical History He  has a past medical history of Heart murmur; Kidney stones; and HTN (hypertension).  Past Surgical History He  has past surgical history that includes Fracture surgery and Fracture surgery.  Current Outpatient Prescriptions on File Prior to Visit  Medication Sig  . hydrochlorothiazide (HYDRODIURIL) 25 MG tablet TAKE ONE TABLET BY MOUTH ONCE DAILY   No current facility-administered medications on file prior to visit.    No Known Allergies  Family History His family history includes Heart disease in his father and paternal grandfather; Hypertension in his father, sister, sister, and sister.  Social History He  reports that he has been smoking Cigarettes.  He has a 10 pack-year smoking history. He has never used smokeless tobacco. He reports that he drinks about 1.2 - 1.8 oz of alcohol per week. He reports that he does not use illicit drugs.  Review of systems Constitutional: Negative for fever and unexpected weight change.  HENT: Negative for congestion, dental problem, ear pain, nosebleeds, postnasal drip, rhinorrhea, sinus pressure, sneezing, sore throat and trouble swallowing.   Eyes: Negative for redness and itching.  Respiratory: Negative for cough, chest tightness, shortness of breath and wheezing.   Cardiovascular: Negative for palpitations and leg swelling.  Gastrointestinal: Negative for nausea and vomiting.  Genitourinary: Negative for dysuria.  Musculoskeletal: Negative for joint swelling.  Skin: Negative for rash.  Neurological: Negative for headaches.  Hematological: Does not bruise/bleed easily.  Psychiatric/Behavioral: Negative for dysphoric mood. The patient is not nervous/anxious.     Chief Complaint  Patient presents with  . Sleep Consult    Referred by Dr Carver Fila. Sleep study 2016 - [facility]. Epworth Score:     Tests: PSG 02/06/15 >> AHI 86, SpO2 low 79%  Vital signs BP 144/82 mmHg  Pulse 97  Ht 6' 2.5" (1.892 m)  Wt 204 lb (92.534  kg)  BMI 25.85 kg/m2  SpO2 98%  History of Present Illness Eric Owens is a 40 y.o. male for evaluation of sleep problems.  He has noticed trouble with his sleep for years.  He snores, and his wife says he stops breathing.  He gets dry mouth.  He feels tired all the time.    He had sleep study in October and this showed severe sleep apnea.  He goes to sleep at 10 pm.  He falls asleep after 30 minutes.  He wakes up some times to use the bathroom.  He gets out of bed at 6 am.  He feels tired in the morning.  He denies morning headache.  He does not use anything to help him fall sleep or stay awake.  He denies sleep walking, sleep talking, bruxism, or nightmares.  There is no history of restless legs.  He denies sleep hallucinations, sleep paralysis, or cataplexy.  The Epworth score is 11 out of 24.   Physical Exam:  General - No distress ENT - No sinus tenderness, no oral exudate, no LAN, no thyromegaly, TM clear, pupils equal/reactive, nasal septal deviation, over bite, scalloped tongue, 4+ tonsils Cardiac - s1s2 regular, no murmur, pulses symmetric Chest - No wheeze/rales/dullness, good air entry, normal respiratory excursion Back - No focal tenderness Abd - Soft, non-tender, no organomegaly, + bowel sounds Ext - No edema Neuro - Normal strength, cranial nerves intact Skin - No rashes Psych - Normal mood, and behavior  Discussion: He has snoring, sleep disruption, witnessed apnea and daytime sleepiness.  He has hx of hypertension.  His recent sleep study showed severe sleep  apnea.  We discussed how sleep apnea can affect various health problems, including risks for hypertension, cardiovascular disease, and diabetes.  We also discussed how sleep disruption can increase risks for accidents, such as while driving.  Weight loss as a means of improving sleep apnea was also reviewed.  Additional treatment options discussed were CPAP therapy, oral appliance, and surgical  intervention.   Assessment/plan:  Obstructive sleep apnea. Plan: - will arrange for auto CPAP set up  Tonsillar hypertrophy. Plan: - [facility] - advised that he could consider surgical intervention for his OSA if he is unable to tolerate alternative therapies   Patient Instructions  Will arrange for CPAP set up  Follow up in 2 to 3 months with Dr. Craige CottaSood or Nurse Practitioner     Coralyn HellingVineet Bennie Chirico, M.D. Pager [phone]

## 2015-07-15 NOTE — Patient Instructions (Addendum)
Will arrange for CPAP set up  Follow up in 2 to 3 months with Dr. Craige CottaSood or Nurse Practitioner

## 2015-10-01 ENCOUNTER — Emergency Department (HOSPITAL_BASED_OUTPATIENT_CLINIC_OR_DEPARTMENT_OTHER)
Admission: EM | Admit: 2015-10-01 | Discharge: 2015-10-01 | Disposition: A | Discharge status: Home / Self Care | Payer: BLUE CROSS/BLUE SHIELD | Attending: Emergency Medicine | Admitting: Emergency Medicine

## 2015-10-01 ENCOUNTER — Emergency Department (HOSPITAL_BASED_OUTPATIENT_CLINIC_OR_DEPARTMENT_OTHER): Payer: BLUE CROSS/BLUE SHIELD

## 2015-10-01 ENCOUNTER — Ambulatory Visit: Payer: BLUE CROSS/BLUE SHIELD | Admitting: Internal Medicine

## 2015-10-01 ENCOUNTER — Telehealth: Payer: Self-pay | Admitting: Family

## 2015-10-01 ENCOUNTER — Encounter (HOSPITAL_BASED_OUTPATIENT_CLINIC_OR_DEPARTMENT_OTHER): Payer: Self-pay | Admitting: Emergency Medicine

## 2015-10-01 DIAGNOSIS — R200 Anesthesia of skin: Secondary | ICD-10-CM

## 2015-10-01 DIAGNOSIS — I10 Essential (primary) hypertension: Secondary | ICD-10-CM | POA: Insufficient documentation

## 2015-10-01 DIAGNOSIS — F17210 Nicotine dependence, cigarettes, uncomplicated: Secondary | ICD-10-CM | POA: Diagnosis not present

## 2015-10-01 DIAGNOSIS — Z79899 Other long term (current) drug therapy: Secondary | ICD-10-CM | POA: Diagnosis not present

## 2015-10-01 DIAGNOSIS — R202 Paresthesia of skin: Secondary | ICD-10-CM | POA: Insufficient documentation

## 2015-10-01 LAB — DIFFERENTIAL
Basophils Absolute: 0 10*3/uL (ref 0.0–0.1)
Basophils Relative: 0 %
Eosinophils Absolute: 0.2 10*3/uL (ref 0.0–0.7)
Eosinophils Relative: 2 %
Lymphocytes Relative: 38 %
Lymphs Abs: 3.8 10*3/uL (ref 0.7–4.0)
Monocytes Absolute: 0.6 10*3/uL (ref 0.1–1.0)
Monocytes Relative: 6 %
Neutro Abs: 5.4 10*3/uL (ref 1.7–7.7)
Neutrophils Relative %: 54 %

## 2015-10-01 LAB — COMPREHENSIVE METABOLIC PANEL
ALT: 44 U/L (ref 17–63)
AST: 41 U/L (ref 15–41)
Albumin: 4.5 g/dL (ref 3.5–5.0)
Alkaline Phosphatase: 62 U/L (ref 38–126)
Anion gap: 10 (ref 5–15)
BUN: 16 mg/dL (ref 6–20)
CO2: 27 mmol/L (ref 22–32)
Calcium: 9.7 mg/dL (ref 8.9–10.3)
Chloride: 101 mmol/L (ref 101–111)
Creatinine, Ser: 1.07 mg/dL (ref 0.61–1.24)
GFR calc Af Amer: >60 mL/min (ref >60)
GFR calc non Af Amer: >60 mL/min (ref >60)
Glucose, Bld: 107 mg/dL — ABNORMAL HIGH (ref 65–99)
Potassium: 2.8 mmol/L — ABNORMAL LOW (ref 3.5–5.1)
Sodium: 138 mmol/L (ref 135–145)
Total Bilirubin: 0.8 mg/dL (ref 0.3–1.2)
Total Protein: 8.4 g/dL — ABNORMAL HIGH (ref 6.5–8.1)

## 2015-10-01 LAB — CBC
HCT: 43.7 % (ref 39.0–52.0)
Hemoglobin: 15 g/dL (ref 13.0–17.0)
MCH: 30.6 pg (ref 26.0–34.0)
MCHC: 34.3 g/dL (ref 30.0–36.0)
MCV: 89.2 fL (ref 78.0–100.0)
Platelets: 383 10*3/uL (ref 150–400)
RBC: 4.9 MIL/uL (ref 4.22–5.81)
RDW: 13.7 % (ref 11.5–15.5)
WBC: 10.1 10*3/uL (ref 4.0–10.5)

## 2015-10-01 LAB — PROTIME-INR
INR: 0.95 (ref 0.00–1.49)
Prothrombin Time: 12.9 seconds (ref 11.6–15.2)

## 2015-10-01 LAB — APTT: aPTT: 28 seconds (ref 24–37)

## 2015-10-01 LAB — TROPONIN I: Troponin I: <0.03 ng/mL (ref <0.031)

## 2015-10-01 LAB — CBG MONITORING, ED: Glucose-Capillary: 111 mg/dL — ABNORMAL HIGH (ref 65–99)

## 2015-10-01 MED ORDER — HYDROCHLOROTHIAZIDE 25 MG PO TABS: 25.0000 mg | ORAL_TABLET | Freq: Every day | ORAL | Status: DC

## 2015-10-01 MED ORDER — POTASSIUM CHLORIDE CRYS ER 20 MEQ PO TBCR
60.0000 meq | EXTENDED_RELEASE_TABLET | Freq: Once | ORAL | Status: AC
  Administered 2015-10-01: 60 meq via ORAL
  Filled 2015-10-01: qty 3

## 2015-10-01 MED ORDER — POTASSIUM CHLORIDE ER 10 MEQ PO TBCR: 10.0000 meq | EXTENDED_RELEASE_TABLET | Freq: Every day | ORAL | Status: DC

## 2015-10-01 MED FILL — POTASSIUM CL 10 MEQ TAB SA: 10 | 30 days supply | Qty: 30 | Fill #0

## 2015-10-01 NOTE — Telephone Encounter (Signed)
Patient Name: Jacklyn ShellJAMES Dsouza DOB: [birth year] Initial Comment Caller states husband been having tingling in arms and legs, feeling light headed Nurse Assessment Nurse: Williams CheHubbard, RN, April Date/Time (Eastern Time): 10/01/2015 8:39:39 AM Confirm and document reason for call. If symptomatic, describe symptoms. You must click the next button to save text entered. ---Caller states her husband is having some tingling in his arm and leg on the right side. He feels dizzy, lightheaded and has a headache that comes and goes. Has the patient traveled out of the country within the last 30 days? ---No Does the patient have any new or worsening symptoms? ---Yes Will a triage be completed? ---Yes Related visit to physician within the last 2 weeks? ---No Does the PT have any chronic conditions? (i.e. diabetes, asthma, etc.) ---Yes List chronic conditions. ---hypertension Is this a behavioral health or substance abuse call? ---No Guidelines Guideline Title Affirmed Question Affirmed Notes Dizziness - Lightheadedness SEVERE dizziness (e.g., unable to stand, requires support to walk, feels like passing out now) Final Disposition User Go to ED Now (or PCP triage) Williams CheHubbard, RN, April Referrals REFERRED TO PCP OFFICE [facility]  - ED Disagree/Comply: Comply

## 2015-10-01 NOTE — ED Provider Notes (Signed)
CSN: [id number]     Arrival date & time 10/01/15  5784 History   First MD Initiated Contact with Patient 10/01/15 0940     Chief Complaint  Patient presents with  . arm numbness      (Consider location/radiation/quality/duration/timing/severity/associated sxs/prior Treatment) HPI   40 year old male with left upper extremity numbness and tingling.  Onset this morning while in bed.  Intermittent.  Feels like his arm is asleep.    It is not painful.  Denies any recent trauma or overuse.  He denies any neck pain.  Chest pain.  No shortness of breath. He has not tried taking anything for his symptoms. He is not sure if he may have "slept on it wrong."   Past Medical History  Diagnosis Date  . Heart murmur     Childhood  . Kidney stones   . HTN (hypertension)    Past Surgical History  Procedure Laterality Date  . Fracture surgery      Left Hand  . Fracture surgery      left hand   Family History  Problem Relation Age of Onset  . Heart disease Father   . Hypertension Father   . Hypertension Sister   . Heart disease Paternal Grandfather   . Hypertension Sister   . Hypertension Sister    Social History  Substance Use Topics  . Smoking status: Current Every Day Smoker -- 1.00 packs/day for 10 years    Types: Cigarettes  . Smokeless tobacco: Never Used  . Alcohol Use: 1.2 - 1.8 oz/week    2-3 Shots of liquor per week    Review of Systems  All systems reviewed and negative, other than as noted in HPI.   Allergies  Review of patient's allergies indicates no known allergies.  Home Medications   Prior to Admission medications   Medication Sig Start Date End Date Taking? Authorizing Provider  hydrochlorothiazide (HYDRODIURIL) 25 MG tablet TAKE ONE TABLET BY MOUTH ONCE DAILY 02/25/15   Veryl Speak, FNP   BP 142/100 mmHg  Pulse 97  Temp(Src) 98.2 F (36.8 C) (Oral)  Resp 24  Ht  (1.88 m)  Wt 200 lb (90.719 kg)  BMI 25.67 kg/m2  SpO2 100% Physical Exam   Constitutional: He appears well-developed and well-nourished. No distress.  HENT:  Head: Normocephalic and atraumatic.  Eyes: Conjunctivae are normal. Right eye exhibits no discharge. Left eye exhibits no discharge.  Neck: Neck supple.  Cardiovascular: Normal rate, regular rhythm and normal heart sounds.  Exam reveals no gallop and no friction rub.   No murmur heard. Pulmonary/Chest: Effort normal and breath sounds normal. No respiratory distress.  Abdominal: Soft. He exhibits no distension. There is no tenderness.  Musculoskeletal: He exhibits no edema or tenderness.  Upper extremities grossly normal in appearance.  Symmetric.  No tenderness.  No rash.  No swelling.  Strength is 5 out of 5 bilateral upper extremities.  Sensation is intact to light touch.  Motor function volar, median and radial nerves is intact.  Neurological: He is alert.  Skin: Skin is warm and dry.  Psychiatric: He has a normal mood and affect. His behavior is normal. Thought content normal.  Nursing note and vitals reviewed.   ED Course  Procedures (including critical care time) Labs Review Labs Reviewed  COMPREHENSIVE METABOLIC PANEL - Abnormal; Notable for the following:    Potassium 2.8 (*)    Glucose, Bld 107 (*)    Total Protein 8.4 (*)    All  other components within normal limits  CBG MONITORING, ED - Abnormal; Notable for the following:    Glucose-Capillary 111 (*)    All other components within normal limits  PROTIME-INR  APTT  CBC  DIFFERENTIAL  TROPONIN I    Imaging Review Ct Head Wo Contrast  10/01/2015  CLINICAL DATA:  One day history of left upper extremity numbness EXAM: CT HEAD WITHOUT CONTRAST TECHNIQUE: Contiguous axial images were obtained from the base of the skull through the vertex without intravenous contrast. COMPARISON:  None. FINDINGS: The ventricles are normal in size and configuration. There is no intracranial mass, hemorrhage, extra-axial fluid collection, or midline shift. The  gray-white compartments are normal. No acute infarct evident. The bony calvarium appears intact. The mastoid air cells are clear. Orbits appear symmetric bilaterally. IMPRESSION: Study within normal limits. Electronically Signed   By: Bretta BangWilliam  Woodruff III M.D.   On: 10/01/2015 10:15   I have personally reviewed and evaluated these images and lab results as part of my medical decision-making.   EKG Interpretation   Date/Time:  Wednesday Oct 01 2015 09:52:54 EDT Ventricular Rate:  95 PR Interval:  148 QRS Duration: 75 QT Interval:  307 QTC Calculation: 386 R Axis:   54 Text Interpretation:  Sinus rhythm Borderline T wave abnormalities  Confirmed by Juleen ChinaKOHUT  MD, Aaryan Essman ([id number]) on 10/01/2015 10:45:11 AM      MDM   Final diagnoses:  Numbness and tingling    40 year old male with numbness and tingling.  I doubt that this is ACS.  Potassium is 2.8.  Symptoms may very well be secondary to hyperkalemia.  Supplemented. It has been determined that no acute conditions requiring further emergency intervention are present at this time. The patient has been advised of the diagnosis and plan. I reviewed any labs and imaging including any potential incidental findings. We have discussed signs and symptoms that warrant return to the ED and they are listed in the discharge instructions.      Raeford RazorStephen Melda Mermelstein, MD 10/12/15 [phone]

## 2015-10-01 NOTE — ED Notes (Signed)
Pt c/o intermittent L arm numbness and tingling which started at 4 this morning with dizziness. Pt denies chest pain, SOB. Pt denies any numbness in his legs.

## 2015-10-23 ENCOUNTER — Encounter: Payer: Self-pay | Admitting: Pulmonary Disease

## 2015-10-23 ENCOUNTER — Ambulatory Visit (INDEPENDENT_AMBULATORY_CARE_PROVIDER_SITE_OTHER): Payer: BLUE CROSS/BLUE SHIELD | Admitting: Pulmonary Disease

## 2015-10-23 VITALS — BP 128/92 | HR 84 | Ht 74.0 in | Wt 204.6 lb

## 2015-10-23 DIAGNOSIS — Z9989 Dependence on other enabling machines and devices: Secondary | ICD-10-CM

## 2015-10-23 DIAGNOSIS — G4733 Obstructive sleep apnea (adult) (pediatric): Secondary | ICD-10-CM

## 2015-10-23 NOTE — Progress Notes (Signed)
Current Outpatient Prescriptions on File Prior to Visit  Medication Sig  . hydrochlorothiazide (HYDRODIURIL) 25 MG tablet Take 1 tablet (25 mg total) by mouth daily.  . potassium chloride (K-DUR) 10 MEQ tablet Take 1 tablet (10 mEq total) by mouth daily.   No current facility-administered medications on file prior to visit.     Chief Complaint  Patient presents with  . Follow-up    Pt wears CPAP nightly. Denies any problems with mask/pressure setting. Tolerating well. DME: [facility].     Tests PSG 02/06/15 >> AHI 86, SpO2 low 79% Auto CPAP 09/22/15 to 10/23/15 >> used on 25 of 30 nights with average 6 hrs 9 min.  Average AHI 0.6 with median CPAP 8 and 95 th percentile CPAP 11 cm H2O  Past medical hx HTN, Nephrolithiasis  Past surgical hx, Allergies, Family hx, Social hx all reviewed.  Vital Signs BP 128/92 mmHg  Pulse 84  Ht 6\' 2"  (1.88 m)  Wt 204 lb 9.6 oz (92.806 kg)  BMI 26.26 kg/m2  SpO2 96%  History of Present Illness Eric Owens is a 40 y.o. male with obstructive sleep apnea.  He has done well with CPAP.  He has nasal mask.  Sleeping better and more alert.  He was in ER at end of May for arm weakness.  Found to have low potassium.  Physical Exam  General - No distress ENT - No sinus tenderness, no oral exudate, no LAN, 3+ tonsils Cardiac - s1s2 regular, no murmur Chest - No wheeze/rales/dullness Back - No focal tenderness Abd - Soft, non-tender Ext - No edema Neuro - Normal strength Skin - No rashes Psych - normal mood, and behavior   Assessment/Plan  Obstructive sleep apnea. - continue auto CPAP  Hypokalemia. - advised him to f/u with his PCP to determine if he needs to continue potassium supplements  Hypertension. - advised him to d/w his PCP if he should change from HCTZ in setting of hypokalemia - also might be able to re-assess whether he needs antihypertensive medication now that his sleep apnea is controlled   Patient Instructions  Follow  up in 1 year     Coralyn HellingVineet Aakash Hollomon, MD [place] Pager:  [phone] 10/23/2015, 4:55 PM

## 2015-10-23 NOTE — Patient Instructions (Signed)
Follow up in 1 year.

## 2016-03-09 ENCOUNTER — Other Ambulatory Visit: Payer: Self-pay | Admitting: Family

## 2016-03-18 ENCOUNTER — Other Ambulatory Visit: Payer: Self-pay | Admitting: Family

## 2016-04-22 ENCOUNTER — Encounter: Payer: Self-pay | Admitting: Urgent Care

## 2016-04-22 ENCOUNTER — Ambulatory Visit (INDEPENDENT_AMBULATORY_CARE_PROVIDER_SITE_OTHER): Payer: BLUE CROSS/BLUE SHIELD | Admitting: Urgent Care

## 2016-04-22 VITALS — BP 124/86 | HR 108 | Temp 102.6°F | Resp 18 | Ht 74.0 in | Wt 205.0 lb

## 2016-04-22 DIAGNOSIS — J029 Acute pharyngitis, unspecified: Secondary | ICD-10-CM | POA: Diagnosis not present

## 2016-04-22 DIAGNOSIS — I10 Essential (primary) hypertension: Secondary | ICD-10-CM | POA: Diagnosis not present

## 2016-04-22 DIAGNOSIS — R509 Fever, unspecified: Secondary | ICD-10-CM

## 2016-04-22 DIAGNOSIS — E876 Hypokalemia: Secondary | ICD-10-CM | POA: Diagnosis not present

## 2016-04-22 DIAGNOSIS — F17200 Nicotine dependence, unspecified, uncomplicated: Secondary | ICD-10-CM

## 2016-04-22 DIAGNOSIS — J020 Streptococcal pharyngitis: Secondary | ICD-10-CM | POA: Diagnosis not present

## 2016-04-22 LAB — POCT CBC
Granulocyte percent: 86.9 %G — AB (ref 37–80)
HCT, POC: 41.2 % — AB (ref 43.5–53.7)
Hemoglobin: 14.3 g/dL (ref 14.1–18.1)
Lymph, poc: 2.9 (ref 0.6–3.4)
MCH, POC: 30.6 pg (ref 27–31.2)
MCHC: 34.6 g/dL (ref 31.8–35.4)
MCV: 88.3 fL (ref 80–97)
MID (cbc): 0.3 (ref 0–0.9)
MPV: 6.1 fL (ref 0–99.8)
POC Granulocyte: 21.4 — AB (ref 2–6.9)
POC LYMPH PERCENT: 11.8 %L (ref 10–50)
POC MID %: 1.3 %M (ref 0–12)
Platelet Count, POC: 326 10*3/uL (ref 142–424)
RBC: 4.66 M/uL — AB (ref 4.69–6.13)
RDW, POC: 14.1 %
WBC: 24.6 10*3/uL — AB (ref 4.6–10.2)

## 2016-04-22 LAB — POCT RAPID STREP A (OFFICE): Rapid Strep A Screen: POSITIVE — AB

## 2016-04-22 MED ORDER — AMOXICILLIN-POT CLAVULANATE 500-125 MG PO TABS: 1.0000 | ORAL_TABLET | Freq: Two times a day (BID) | ORAL | 0 refills | Status: DC

## 2016-04-22 NOTE — Patient Instructions (Addendum)
Pharyngitis Pharyngitis is redness, pain, and swelling (inflammation) of your pharynx. What are the causes? Pharyngitis is usually caused by infection. Most of the time, these infections are from viruses (viral) and are part of a cold. However, sometimes pharyngitis is caused by bacteria (bacterial). Pharyngitis can also be caused by allergies. Viral pharyngitis may be spread from person to person by coughing, sneezing, and personal items or utensils (cups, forks, spoons, toothbrushes). Bacterial pharyngitis may be spread from person to person by more intimate contact, such as kissing. What are the signs or symptoms? Symptoms of pharyngitis include:  Sore throat.  Tiredness (fatigue).  Low-grade fever.  Headache.  Joint pain and muscle aches.  Skin rashes.  Swollen lymph nodes.  Plaque-like film on throat or tonsils (often seen with bacterial pharyngitis). How is this diagnosed? Your health care provider will ask you questions about your illness and your symptoms. Your medical history, along with a physical exam, is often all that is needed to diagnose pharyngitis. Sometimes, a rapid strep test is done. Other lab tests may also be done, depending on the suspected cause. How is this treated? Viral pharyngitis will usually get better in 3-4 days without the use of medicine. Bacterial pharyngitis is treated with medicines that kill germs (antibiotics). Follow these instructions at home:  Drink enough water and fluids to keep your urine clear or pale yellow.  Only take over-the-counter or prescription medicines as directed by your health care provider:  If you are prescribed antibiotics, make sure you finish them even if you start to feel better.  Do not take aspirin.  Get lots of rest.  Gargle with 8 oz of salt water ( tsp of salt per 1 qt of water) as often as every 1-2 hours to soothe your throat.  Throat lozenges (if you are not at risk for choking) or sprays may be used to  soothe your throat. Contact a health care provider if:  You have large, tender lumps in your neck.  You have a rash.  You cough up green, yellow-brown, or bloody spit. Get help right away if:  Your neck becomes stiff.  You drool or are unable to swallow liquids.  You vomit or are unable to keep medicines or liquids down.  You have severe pain that does not go away with the use of recommended medicines.  You have trouble breathing (not caused by a stuffy nose). This information is not intended to replace advice given to you by your health care provider. Make sure you discuss any questions you have with your health care provider. Document Released: 04/19/2005 Document Revised: 09/25/2015 Document Reviewed: 12/25/2012 Elsevier Interactive Patient Education  2017 [employer]     Steps to Quit Smoking Smoking tobacco can be harmful to your health and can affect almost every organ in your body. Smoking puts you, and those around you, at risk for developing many serious chronic diseases. Quitting smoking is difficult, but it is one of the best things that you can do for your health. It is never too late to quit. What are the benefits of quitting smoking? When you quit smoking, you lower your risk of developing serious diseases and conditions, such as:  Lung cancer or lung disease, such as COPD.  Heart disease.  Stroke.  Heart attack.  Infertility.  Osteoporosis and bone fractures. Additionally, symptoms such as coughing, wheezing, and shortness of breath may get better when you quit. You may also find that you get sick less often because your  body is stronger at fighting off colds and infections. If you are pregnant, quitting smoking can help to reduce your chances of having a baby of low birth weight. How do I get ready to quit? When you decide to quit smoking, create a plan to make sure that you are successful. Before you quit:  Pick a date to quit. Set a date within the  next two weeks to give you time to prepare.  Write down the reasons why you are quitting. Keep this list in places where you will see it often, such as on your bathroom mirror or in your car or wallet.  Identify the people, places, things, and activities that make you want to smoke (triggers) and avoid them. Make sure to take these actions:  Throw away all cigarettes at home, at work, and in your car.  Throw away smoking accessories, such as Set designer.  Clean your car and make sure to empty the ashtray.  Clean your home, including curtains and carpets.  Tell your family, friends, and coworkers that you are quitting. Support from your loved ones can make quitting easier.  Talk with your health care provider about your options for quitting smoking.  Find out what treatment options are covered by your health insurance. What strategies can I use to quit smoking? Talk with your healthcare provider about different strategies to quit smoking. Some strategies include:  Quitting smoking altogether instead of gradually lessening how much you smoke over a period of time. Research shows that quitting "cold [place]" is more successful than gradually quitting.  Attending in-person counseling to help you build problem-solving skills. You are more likely to have success in quitting if you attend several counseling sessions. Even short sessions of 10 minutes can be effective.  Finding resources and support systems that can help you to quit smoking and remain smoke-free after you quit. These resources are most helpful when you use them often. They can include:  Online chats with a Veterinary surgeon.  Telephone quitlines.  Printed Materials engineer.  Support groups or group counseling.  Text messaging programs.  Mobile phone applications.  Taking medicines to help you quit smoking. (If you are pregnant or breastfeeding, talk with your health care provider first.) Some medicines contain  nicotine and some do not. Both types of medicines help with cravings, but the medicines that include nicotine help to relieve withdrawal symptoms. Your health care provider may recommend:  Nicotine patches, gum, or lozenges.  Nicotine inhalers or sprays.  Non-nicotine medicine that is taken by mouth. Talk with your health care provider about combining strategies, such as taking medicines while you are also receiving in-person counseling. Using these two strategies together makes you more likely to succeed in quitting than if you used either strategy on its own. If you are pregnant or breastfeeding, talk with your health care provider about finding counseling or other support strategies to quit smoking. Do not take medicine to help you quit smoking unless told to do so by your health care provider. What things can I do to make it easier to quit? Quitting smoking might feel overwhelming at first, but there is a lot that you can do to make it easier. Take these important actions:  Reach out to your family and friends and ask that they support and encourage you during this time. Call telephone quitlines, reach out to support groups, or work with a counselor for support.  Ask people who smoke to avoid smoking around you.  Avoid  places that trigger you to smoke, such as bars, parties, or smoke-break areas at work.  Spend time around people who do not smoke.  Lessen stress in your life, because stress can be a smoking trigger for some people. To lessen stress, try:  Exercising regularly.  Deep-breathing exercises.  Yoga.  Meditating.  Performing a body scan. This involves closing your eyes, scanning your body from head to toe, and noticing which parts of your body are particularly tense. Purposefully relax the muscles in those areas.  Download or purchase mobile phone or tablet apps (applications) that can help you stick to your quit plan by providing reminders, tips, and encouragement.  There are many free apps, such as QuitGuide from the [employer] developer(Centers for Disease Control and Prevention). You can find other support for quitting smoking (smoking cessation) through smokefree.gov and other websites. How will I feel when I quit smoking? Within the first 24 hours of quitting smoking, you may start to feel some withdrawal symptoms. These symptoms are usually most noticeable 2-3 days after quitting, but they usually do not last beyond 2-3 weeks. Changes or symptoms that you might experience include:  Mood swings.  Restlessness, anxiety, or irritation.  Difficulty concentrating.  Dizziness.  Strong cravings for sugary foods in addition to nicotine.  Mild weight gain.  Constipation.  Nausea.  Coughing or a sore throat.  Changes in how your medicines work in your body.  A depressed mood.  Difficulty sleeping (insomnia). After the first 2-3 weeks of quitting, you may start to notice more positive results, such as:  Improved sense of smell and taste.  Decreased coughing and sore throat.  Slower heart rate.  Lower blood pressure.  Clearer skin.  The ability to breathe more easily.  Fewer sick days. Quitting smoking is very challenging for most people. Do not get discouraged if you are not successful the first time. Some people need to make many attempts to quit before they achieve long-term success. Do your best to stick to your quit plan, and talk with your health care provider if you have any questions or concerns. This information is not intended to replace advice given to you by your health care provider. Make sure you discuss any questions you have with your health care provider. Document Released: 04/13/2001 Document Revised: 12/16/2015 Document Reviewed: 09/03/2014 Elsevier Interactive Patient Education  2017 [employer]     IF you received an x-ray today, you will receive an invoice from [facility]. Please contact [facility] at  [phone] with questions or concerns regarding your invoice.   IF you received labwork today, you will receive an invoice from [facility]. Please contact [facility] at [phone] with questions or concerns regarding your invoice.   Our billing staff will not be able to assist you with questions regarding bills from these companies.  You will be contacted with the lab results as soon as they are available. The fastest way to get your results is to activate your My Chart account. Instructions are located on the last page of this paperwork. If you have not heard from us regarding the results in 2 weeks, please contact this office.

## 2016-04-22 NOTE — Progress Notes (Signed)
MRN: [id number] DOB: [birth year]  Subjective:   Eric Owens is a 40 y.o. male presenting for chief complaint of Sore Throat (x 2 days) and Chills  Reports 2 day history of worsening sore throat, muffled voice, neck pain, right ear pain, chills. Has tried APAP without much relief. Denies sinus congestion, cough, chest pain, shob, n/v, abdominal pain, rashes, body aches. Denies smoking cigarettes.  Of note, patient was seen in ED on 10/01/2015, had hypokalemia of 2.8. His PCP has not seen patient for follow up. Currently patient is taking K+ supplement and HCTZ. Has a history of sleep apnea and is on CPAP. His pulmonologist recommended f/u with PCP, consideration of switching therapy for HTN off of HCTZ or coming off of this all together given treatment of his OSA now.   Eric Owens has a current medication list which includes the following prescription(s): hydrochlorothiazide, hydrochlorothiazide, and potassium chloride. Also has No Known Allergies.  Eric Owens  has a past medical history of Heart murmur; HTN (hypertension); Kidney stones; and OSA (obstructive sleep apnea). Also  has a past surgical history that includes Fracture surgery and Fracture surgery.  Objective:   Vitals: BP 124/86 (BP Location: Right Arm, Patient Position: Sitting, Cuff Size: Normal)   Pulse (!) 108   Temp (!) 102.6 F (39.2 C) (Oral)   Resp 18   Ht 6\' 2"  (1.88 m)   Wt 205 lb (93 kg)   SpO2 97%   BMI 26.32 kg/m   BP Readings from Last 3 Encounters:  04/22/16 124/86  10/23/15 (!) 128/92  10/01/15 128/83    Physical Exam  Constitutional: He is oriented to person, place, and time. He appears well-developed and well-nourished.  HENT:  TM's intact bilaterally, no effusions or erythema. Nasal turbinates pink and moist, nasal passages patent. No sinus tenderness. Oropharynx with right-sided erythema, exudates, mucous membranes moist, dentition in good repair.   Eyes: Right eye exhibits no discharge. Left eye  exhibits no discharge.  Neck: Normal range of motion. Neck supple.  Cardiovascular: Normal rate, regular rhythm and intact distal pulses.  Exam reveals no gallop and no friction rub.   No murmur heard. Pulmonary/Chest: No respiratory distress. He has no wheezes. He has no rales.  Lymphadenopathy:    He has cervical adenopathy (R>L).  Neurological: He is alert and oriented to person, place, and time.  Skin: Skin is warm and dry.   Results for orders placed or performed in visit on 04/22/16 (from the past 24 hour(s))  POCT rapid strep A     Status: Abnormal   Collection Time: 04/22/16  3:37 PM  Result Value Ref Range   Rapid Strep A Screen Positive (A) Negative  POCT CBC     Status: Abnormal   Collection Time: 04/22/16  3:45 PM  Result Value Ref Range   WBC 24.6 (A) 4.6 - 10.2 K/uL   Lymph, poc 2.9 0.6 - 3.4   POC LYMPH PERCENT 11.8 10 - 50 %L   MID (cbc) 0.3 0 - 0.9   POC MID % 1.3 0 - 12 %M   POC Granulocyte 21.4 (A) 2 - 6.9   Granulocyte percent 86.9 (A) 37 - 80 %G   RBC 4.66 (A) 4.69 - 6.13 M/uL   Hemoglobin 14.3 14.1 - 18.1 g/dL   HCT, POC 57.841.2 (A) 46.943.5 - 53.7 %   MCV 88.3 80 - 97 fL   MCH, POC 30.6 27 - 31.2 pg   MCHC 34.6 31.8 - 35.4 g/dL  RDW, POC 14.1 %   Platelet Count, POC 326 142 - 424 K/uL   MPV 6.1 0 - 99.8 fL   Assessment and Plan :   1. Strep pharyngitis 2. Sore throat 3. Fever, unspecified fever cause - Start Augmentin 500mg  BID, rtc in 1 week if no improvement.  4. Essential hypertension 5. Hypokalemia - Patient to consider establishing care with me. Will consider switching off of HCTZ and moving to lis, amlodipine. - Basic metabolic panel  6. Tobacco use disorder - Counseled on smoking cessation  Eric BambergMario Rayshad Riviello, PA-C [facility] [phone] 04/22/2016 3:20 PM

## 2016-04-23 LAB — BASIC METABOLIC PANEL
BUN/Creatinine Ratio: 11 (ref 9–20)
BUN: 13 mg/dL (ref 6–20)
CO2: 27 mmol/L (ref 18–29)
Calcium: 9.8 mg/dL (ref 8.7–10.2)
Chloride: 93 mmol/L — ABNORMAL LOW (ref 96–106)
Creatinine, Ser: 1.14 mg/dL (ref 0.76–1.27)
GFR calc Af Amer: 94 mL/min/{1.73_m2} (ref >59)
GFR calc non Af Amer: 81 mL/min/{1.73_m2} (ref >59)
Glucose: 90 mg/dL (ref 65–99)
Potassium: 3.5 mmol/L (ref 3.5–5.2)
Sodium: 137 mmol/L (ref 134–144)

## 2016-04-24 LAB — CULTURE, GROUP A STREP: Strep A Culture: POSITIVE — AB

## 2016-04-25 ENCOUNTER — Telehealth: Payer: Self-pay | Admitting: Family Medicine

## 2016-04-25 MED ORDER — PREDNISONE 20 MG PO TABS: ORAL_TABLET | ORAL | 0 refills | Status: DC

## 2016-04-25 NOTE — Telephone Encounter (Signed)
Patient called after hours line reporting that sore throat improving with Augmentin therapy yet suffering with ongoing swelling.  Taking Ibuprofen 400mg  every four hours.  Pt requesting Prednisone to help with swelling.  Upon review of chart, glucose normal on 04/22/16; no history of diabetes.  A/P: strep throat: continue Augmentin; add Prednisone.

## 2016-04-27 NOTE — Telephone Encounter (Signed)
I spoke with patient on 04/25/2016. I had patient start 20mg  prednisone QD.

## 2016-05-13 ENCOUNTER — Ambulatory Visit (INDEPENDENT_AMBULATORY_CARE_PROVIDER_SITE_OTHER): Payer: BLUE CROSS/BLUE SHIELD | Admitting: Family

## 2016-05-13 ENCOUNTER — Other Ambulatory Visit (INDEPENDENT_AMBULATORY_CARE_PROVIDER_SITE_OTHER): Payer: BLUE CROSS/BLUE SHIELD

## 2016-05-13 VITALS — BP 128/86 | HR 88 | Temp 98.8°F | Resp 16 | Ht 74.0 in | Wt 201.8 lb

## 2016-05-13 DIAGNOSIS — I10 Essential (primary) hypertension: Secondary | ICD-10-CM | POA: Diagnosis not present

## 2016-05-13 LAB — BASIC METABOLIC PANEL
BUN: 11 mg/dL (ref 6–23)
CO2: 32 mEq/L (ref 19–32)
Calcium: 9.8 mg/dL (ref 8.4–10.5)
Chloride: 99 mEq/L (ref 96–112)
Creatinine, Ser: 0.89 mg/dL (ref 0.40–1.50)
GFR: 122.83 mL/min (ref >60.00)
Glucose, Bld: 94 mg/dL (ref 70–99)
Potassium: 3.1 mEq/L — ABNORMAL LOW (ref 3.5–5.1)
Sodium: 139 mEq/L (ref 135–145)

## 2016-05-13 MED ORDER — AMLODIPINE BESYLATE 10 MG PO TABS: 10.0000 mg | ORAL_TABLET | Freq: Every day | ORAL | 0 refills | Status: DC

## 2016-05-13 NOTE — Patient Instructions (Signed)
Thank you for choosing [facility].  SUMMARY AND INSTRUCTIONS:  Continue to monitor blood pressure at home.   Follow up in 2 weeks for a blood pressure check.   Medication:  STOP Hydrochlorothizide and START Amlodipine.  Your prescription(s) have been submitted to your pharmacy or been printed and provided for you. Please take as directed and contact our office if you believe you are having problem(s) with the medication(s) or have any questions.  Labs:  Please stop by the lab on the lower level of the building for your blood work. Your results will be released to MyChart (or called to you) after review, usually within 72 hours after test completion. If any changes need to be made, you will be notified at that same time.  1.) The lab is open from 7:30am to 5:30 pm Monday-Friday 2.) No appointment is necessary 3.) Fasting (if needed) is 6-8 hours after food and drink; black coffee and water are okay   Follow up:  If your symptoms worsen or fail to improve, please contact our office for further instruction, or in case of emergency go directly to the emergency room at the closest medical facility.

## 2016-05-13 NOTE — Assessment & Plan Note (Signed)
Blood pressure is stable with concern for electrolyte disturbance. Obtain BMET. Discontinue hydrochlorothiazide. Start amlodipine. Continue to monitor blood pressure at home and follow a low sodium diet. Denies worst headache of life with no symptoms of end organ damage. Follow up nurse visit in 2 weeks.

## 2016-05-13 NOTE — Progress Notes (Signed)
Subjective:    Patient ID: Eric Owens, male    DOB: October 12, 1975, 41 y.o.   MRN: [id number]  Chief Complaint  Patient presents with  . BP follow up    blood pressure, would like another medication for blood pressure    HPI:  Eric Owens is a 41 y.o. male who  has a past medical history of Heart murmur; HTN (hypertension); Kidney stones; and OSA (obstructive sleep apnea). and presents today for an office follow up.   Hypertension - Currently maintained on hydrochlorothiazide. Reports taking the medication as prescribed and has had 2 episodes of hypokalemia since starting the medication. Blood pressure at home has been well controlled. Denies worst headache of life or new symptoms of end organ damage.   BP Readings from Last 3 Encounters:  05/13/16 128/86  04/22/16 124/86  10/23/15 (!) 128/92    No Known Allergies    Outpatient Medications Prior to Visit  Medication Sig Dispense Refill  . potassium chloride (K-DUR) 10 MEQ tablet Take 1 tablet (10 mEq total) by mouth daily. 30 tablet 0  . hydrochlorothiazide (HYDRODIURIL) 25 MG tablet Take 1 tablet (25 mg total) by mouth daily. Needs an office visit for anymore refills 30 tablet 0  . amoxicillin-clavulanate (AUGMENTIN) 500-125 MG tablet Take 1 tablet (500 mg total) by mouth 2 (two) times daily. 20 tablet 0  . predniSONE (DELTASONE) 20 MG tablet Two tablets daily x 5 days then one tablet daily x 2 days 12 tablet 0   No facility-administered medications prior to visit.     Review of Systems  Constitutional: Negative for chills and fever.  Eyes:       Negative for changes in vision  Respiratory: Negative for cough, chest tightness and wheezing.   Cardiovascular: Negative for chest pain, palpitations and leg swelling.  Neurological: Negative for dizziness, weakness and light-headedness.      Objective:    BP 128/86 (BP Location: Left Arm, Patient Position: Sitting, Cuff Size: Large)   Pulse 88   Temp 98.8 F (37.1 C)  (Oral)   Resp 16   Ht 6\' 2"  (1.88 m)   Wt 201 lb 12.8 oz (91.5 kg)   SpO2 98%   BMI 25.91 kg/m  Nursing note and vital signs reviewed.  Physical Exam  Constitutional: He is oriented to person, place, and time. He appears well-developed and well-nourished. No distress.  Neck: Neck supple.  Cardiovascular: Normal rate, regular rhythm, normal heart sounds and intact distal pulses.   Pulmonary/Chest: Effort normal and breath sounds normal.  Neurological: He is alert and oriented to person, place, and time.  Skin: Skin is warm and dry.  Psychiatric: He has a normal mood and affect. His behavior is normal. Judgment and thought content normal.       Assessment & Plan:   Problem List Items Addressed This Visit      Cardiovascular and Mediastinum   Essential hypertension - Primary    Blood pressure is stable with concern for electrolyte disturbance. Obtain BMET. Discontinue hydrochlorothiazide. Start amlodipine. Continue to monitor blood pressure at home and follow a low sodium diet. Denies worst headache of life with no symptoms of end organ damage. Follow up nurse visit in 2 weeks.       Relevant Medications   amLODipine (NORVASC) 10 MG tablet   Other Relevant Orders   Basic Metabolic Panel (BMET) (Completed)       I have discontinued Mr. Neenan hydrochlorothiazide, amoxicillin-clavulanate, and predniSONE. I am also having  him start on amLODipine. Additionally, I am having him maintain his potassium chloride.   Meds ordered this encounter  Medications  . amLODipine (NORVASC) 10 MG tablet    Sig: Take 1 tablet (10 mg total) by mouth daily.    Dispense:  30 tablet    Refill:  0    Order Specific Question:   Supervising Provider    Answer:   Hillard DankerRAWFORD, ELIZABETH A [[id number]]     Follow-up: Return if symptoms worsen or fail to improve.  Jeanine Luzalone, Sokha Craker, FNP

## 2016-06-07 ENCOUNTER — Ambulatory Visit (INDEPENDENT_AMBULATORY_CARE_PROVIDER_SITE_OTHER): Payer: BLUE CROSS/BLUE SHIELD | Admitting: Physician Assistant

## 2016-06-07 VITALS — BP 128/90 | HR 88 | Temp 98.7°F | Resp 16 | Ht 74.0 in | Wt 201.0 lb

## 2016-06-07 DIAGNOSIS — J020 Streptococcal pharyngitis: Secondary | ICD-10-CM

## 2016-06-07 DIAGNOSIS — J039 Acute tonsillitis, unspecified: Secondary | ICD-10-CM

## 2016-06-07 LAB — POCT RAPID STREP A (OFFICE): Rapid Strep A Screen: POSITIVE — AB

## 2016-06-07 MED ORDER — AMOXICILLIN 875 MG PO TABS: 875.0000 mg | ORAL_TABLET | Freq: Two times a day (BID) | ORAL | 0 refills | Status: DC

## 2016-06-07 NOTE — Progress Notes (Signed)
Patient ID: Eric Owens, male    DOB: [birth year], 41 y.o.   MRN: [id number]  PCP: Jeanine Luz, FNP  Chief Complaint  Patient presents with  . Sore Throat    X 3 weeks    Subjective:   Presents for evaluation of sore throat x 3 weeks.  Was seen here for sore throat 04/22/2016. Was diagnosed with strep throat and prescribed amoxicillin and then oral steroids. He went out of town and didn't finish the prednisone. The sore throat resolved.   He reports that his tonsils have remained inflamed and enlarged since then, but worsened again about 3 weeks ago. He does not have the "white patches" that he did in December and the pain is not as bad. He has minimal pain. Rates pain 3-4/10. Taking an Advil helps. His voice is altered, and he's finding himself spitting instead of swallowing his secretions. Has CPAP, wears CPAP. No nasal congestion. No fever/chills. No nausea, vomiting or diarrhea. No headache.   Review of Systems As above.    Patient Active Problem List   Diagnosis Date Noted  . Essential hypertension 01/23/2015  . Routine general medical examination at a health care facility 09/17/2014  . OSA (obstructive sleep apnea) 09/17/2014     Prior to Admission medications   Medication Sig Start Date End Date Taking? Authorizing Provider  amLODipine (NORVASC) 10 MG tablet Take 1 tablet (10 mg total) by mouth daily. 05/13/16  Yes Veryl Speak, FNP     No Known Allergies     Objective:  Physical Exam  Constitutional: He is oriented to person, place, and time. He appears well-developed and well-nourished. He is active and cooperative. No distress.  BP 128/90 (BP Location: Right Arm, Patient Position: Sitting, Cuff Size: Normal)   Pulse 88   Temp 98.7 F (37.1 C) (Oral)   Resp 16   Ht 6\' 2"  (1.88 m)   Wt 201 lb (91.2 kg)   SpO2 99%   BMI 25.81 kg/m   HENT:  Head: Normocephalic and atraumatic.  Right Ear: Hearing, tympanic membrane, external ear and ear  canal normal.  Left Ear: Hearing, tympanic membrane, external ear and ear canal normal.  Nose: Nose normal.  Mouth/Throat: Uvula is midline and mucous membranes are normal. Posterior oropharyngeal erythema (mild) present. No oropharyngeal exudate, posterior oropharyngeal edema or tonsillar abscesses.    Eyes: Conjunctivae are normal. No scleral icterus.  Neck: Normal range of motion. Neck supple. No thyromegaly present.  Cardiovascular: Normal rate, regular rhythm and normal heart sounds.   Pulses:      Radial pulses are 2+ on the right side, and 2+ on the left side.  Pulmonary/Chest: Effort normal and breath sounds normal.  Lymphadenopathy:       Head (right side): No tonsillar, no preauricular, no posterior auricular and no occipital adenopathy present.       Head (left side): No tonsillar, no preauricular, no posterior auricular and no occipital adenopathy present.    He has no cervical adenopathy.       Right: No supraclavicular adenopathy present.       Left: No supraclavicular adenopathy present.  Neurological: He is alert and oriented to person, place, and time. No sensory deficit.  Skin: Skin is warm, dry and intact. No rash noted. No cyanosis or erythema. Nails show no clubbing.  Psychiatric: He has a normal mood and affect. His speech is normal and behavior is normal.    Results for orders placed or performed  in visit on 06/07/16  POCT rapid strep A  Result Value Ref Range   Rapid Strep A Screen Positive (A) Negative      Assessment & Plan:   1. Tonsillitis - POCT rapid strep A  2. Strep throat Treat as active strep, though it's possible that he is a carrier. NSAIDS. Rest. Fluids. RTC if symptoms not significantly improved in the next 48 hours, sooner if the symptoms worsen. Anticipatory guidance.  - amoxicillin (AMOXIL) 875 MG tablet; Take 1 tablet (875 mg total) by mouth 2 (two) times daily.  Dispense: 20 tablet; Refill: 0   Fernande Brashelle S. Laxmi Choung, PA-C Physician  Assistant-Certified Primary Care at [facility]

## 2016-06-07 NOTE — Patient Instructions (Addendum)
Get plenty of rest and drink at least 64 ounces of water daily.  YOU NEED A NEW TOOTHBRUSH!!!! Wash your bed linens.  Take the ibuprofen 600 mg 2-3 times each day WITH FOOD.   IF you received an x-ray today, you will receive an invoice from [facility]. Please contact [facility] at [phone] with questions or concerns regarding your invoice.   IF you received labwork today, you will receive an invoice from [facility]. Please contact [facility] at [phone] with questions or concerns regarding your invoice.   Our billing staff will not be able to assist you with questions regarding bills from these companies.  You will be contacted with the lab results as soon as they are available. The fastest way to get your results is to activate your My Chart account. Instructions are located on the last page of this paperwork. If you have not heard from us regarding the results in 2 weeks, please contact this office.     Strep Throat Strep throat is an infection of the throat. It is caused by germs. Strep throat spreads from person to person because of coughing, sneezing, or close contact. Follow these instructions at home: Medicines  Take over-the-counter and prescription medicines only as told by your doctor.  Take your antibiotic medicine as told by your doctor. Do not stop taking the medicine even if you feel better.  Have family members who also have a sore throat or fever go to a doctor. Eating and drinking  Do not share food, drinking cups, or personal items.  Try eating soft foods until your sore throat feels better.  Drink enough fluid to keep your pee (urine) clear or pale yellow. General instructions  Rinse your mouth (gargle) with a salt-water mixture 3-4 times per day or as needed. To make a salt-water mixture, stir -1 tsp of salt into 1 cup of warm water.  Make sure that all people in your house wash their hands well.  Rest.  Stay home from school or  work until you have been taking antibiotics for 24 hours.  Keep all follow-up visits as told by your doctor. This is important. Contact a doctor if:  Your neck keeps getting bigger.  You get a rash, cough, or earache.  You cough up thick liquid that is green, yellow-brown, or bloody.  You have pain that does not get better with medicine.  Your problems get worse instead of getting better.  You have a fever. Get help right away if:  You throw up (vomit).  You get a very bad headache.  You neck hurts or it feels stiff.  You have chest pain or you are short of breath.  You have drooling, very bad throat pain, or changes in your voice.  Your neck is swollen or the skin gets red and tender.  Your mouth is dry or you are peeing less than normal.  You keep feeling more tired or it is hard to wake up.  Your joints are red or they hurt. This information is not intended to replace advice given to you by your health care provider. Make sure you discuss any questions you have with your health care provider. Document Released: 10/06/2007 Document Revised: 12/17/2015 Document Reviewed: 08/12/2014 Elsevier Interactive Patient Education  2017 [employer]

## 2016-06-09 LAB — CULTURE, GROUP A STREP: Strep A Culture: POSITIVE — AB

## 2016-06-17 ENCOUNTER — Telehealth: Payer: Self-pay | Admitting: *Deleted

## 2016-06-17 DIAGNOSIS — I10 Essential (primary) hypertension: Secondary | ICD-10-CM

## 2016-06-17 MED ORDER — AMLODIPINE BESYLATE 10 MG PO TABS: 10.0000 mg | ORAL_TABLET | Freq: Every day | ORAL | 0 refills | Status: DC

## 2016-06-17 NOTE — Telephone Encounter (Signed)
Rec'd call pt states he is needing refill on his BP med, Inform pt per chart he was due back for f/u 3 wks after starting med. Made appt for 07/05/16 and sent 30 day ro [facility]...Raechel Chute/lmb

## 2016-07-05 ENCOUNTER — Encounter: Payer: Self-pay | Admitting: Family

## 2016-07-05 ENCOUNTER — Ambulatory Visit: Payer: BLUE CROSS/BLUE SHIELD | Admitting: Family

## 2016-07-05 ENCOUNTER — Ambulatory Visit (INDEPENDENT_AMBULATORY_CARE_PROVIDER_SITE_OTHER): Payer: BLUE CROSS/BLUE SHIELD | Admitting: Family

## 2016-07-05 DIAGNOSIS — I10 Essential (primary) hypertension: Secondary | ICD-10-CM | POA: Diagnosis not present

## 2016-07-05 MED ORDER — AMLODIPINE BESYLATE 10 MG PO TABS: 10.0000 mg | ORAL_TABLET | Freq: Every day | ORAL | 1 refills | Status: DC

## 2016-07-05 NOTE — Patient Instructions (Signed)
Thank you for choosing [facility].  SUMMARY AND INSTRUCTIONS:  Medication:  Please continue to take your medications as prescribed.   Your prescription(s) have been submitted to your pharmacy or been printed and provided for you. Please take as directed and contact our office if you believe you are having problem(s) with the medication(s) or have any questions.  Follow up:  If your symptoms worsen or fail to improve, please contact our office for further instruction, or in case of emergency go directly to the emergency room at the closest medical facility.    DASH Eating Plan DASH stands for "Dietary Approaches to Stop Hypertension." The DASH eating plan is a healthy eating plan that has been shown to reduce high blood pressure (hypertension). It may also reduce your risk for type 2 diabetes, heart disease, and stroke. The DASH eating plan may also help with weight loss. What are tips for following this plan? General guidelines   Avoid eating more than 2,300 mg (milligrams) of salt (sodium) a day. If you have hypertension, you may need to reduce your sodium intake to 1,500 mg a day.  Limit alcohol intake to no more than 1 drink a day for nonpregnant women and 2 drinks a day for men. One drink equals 12 oz of beer, 5 oz of wine, or 1 oz of hard liquor.  Work with your health care provider to maintain a healthy body weight or to lose weight. Ask what an ideal weight is for you.  Get at least 30 minutes of exercise that causes your heart to beat faster (aerobic exercise) most days of the week. Activities may include walking, swimming, or biking.  Work with your health care provider or diet and nutrition specialist (dietitian) to adjust your eating plan to your individual calorie needs. Reading food labels   Check food labels for the amount of sodium per serving. Choose foods with less than 5 percent of the Daily Value of sodium. Generally, foods with less than 300 mg of sodium  per serving fit into this eating plan.  To find whole grains, look for the word "whole" as the first word in the ingredient list. Shopping   Buy products labeled as "low-sodium" or "no salt added."  Buy fresh foods. Avoid canned foods and premade or frozen meals. Cooking   Avoid adding salt when cooking. Use salt-free seasonings or herbs instead of table salt or sea salt. Check with your health care provider or pharmacist before using salt substitutes.  Do not fry foods. Cook foods using healthy methods such as baking, boiling, grilling, and broiling instead.  Cook with heart-healthy oils, such as olive, canola, soybean, or sunflower oil. Meal planning    Eat a balanced diet that includes:  5 or more servings of fruits and vegetables each day. At each meal, try to fill half of your plate with fruits and vegetables.  Up to 6-8 servings of whole grains each day.  Less than 6 oz of lean meat, poultry, or fish each day. A 3-oz serving of meat is about the same size as a deck of cards. One egg equals 1 oz.  2 servings of low-fat dairy each day.  A serving of nuts, seeds, or beans 5 times each week.  Heart-healthy fats. Healthy fats called Omega-3 fatty acids are found in foods such as flaxseeds and coldwater fish, like sardines, salmon, and mackerel.  Limit how much you eat of the following:  Canned or prepackaged foods.  Food that is high  in trans fat, such as fried foods.  Food that is high in saturated fat, such as fatty meat.  Sweets, desserts, sugary drinks, and other foods with added sugar.  Full-fat dairy products.  Do not salt foods before eating.  Try to eat at least 2 vegetarian meals each week.  Eat more home-cooked food and less restaurant, buffet, and fast food.  When eating at a restaurant, ask that your food be prepared with less salt or no salt, if possible. What foods are recommended? The items listed may not be a complete list. Talk with your dietitian  about what dietary choices are best for you. Grains  Whole-grain or whole-wheat bread. Whole-grain or whole-wheat pasta. Brown rice. Modena Morrow. Bulgur. Whole-grain and low-sodium cereals. Pita bread. Low-fat, low-sodium crackers. Whole-wheat flour tortillas. Vegetables  Fresh or frozen vegetables (raw, steamed, roasted, or grilled). Low-sodium or reduced-sodium tomato and vegetable juice. Low-sodium or reduced-sodium tomato sauce and tomato paste. Low-sodium or reduced-sodium canned vegetables. Fruits  All fresh, dried, or frozen fruit. Canned fruit in natural juice (without added sugar). Meat and other protein foods  Skinless chicken or [place]. Ground chicken or [place]. Pork with fat trimmed off. Fish and seafood. Egg whites. Dried beans, peas, or lentils. Unsalted nuts, nut butters, and seeds. Unsalted canned beans. Lean cuts of beef with fat trimmed off. Low-sodium, lean deli meat. Dairy  Low-fat (1%) or fat-free (skim) milk. Fat-free, low-fat, or reduced-fat cheeses. Nonfat, low-sodium ricotta or cottage cheese. Low-fat or nonfat yogurt. Low-fat, low-sodium cheese. Fats and oils  Soft margarine without trans fats. Vegetable oil. Low-fat, reduced-fat, or light mayonnaise and salad dressings (reduced-sodium). Canola, safflower, olive, soybean, and sunflower oils. Avocado. Seasoning and other foods  Herbs. Spices. Seasoning mixes without salt. Unsalted popcorn and pretzels. Fat-free sweets. What foods are not recommended? The items listed may not be a complete list. Talk with your dietitian about what dietary choices are best for you. Grains  Baked goods made with fat, such as croissants, muffins, or some breads. Dry pasta or rice meal packs. Vegetables  Creamed or fried vegetables. Vegetables in a cheese sauce. Regular canned vegetables (not low-sodium or reduced-sodium). Regular canned tomato sauce and paste (not low-sodium or reduced-sodium). Regular tomato and vegetable juice (not  low-sodium or reduced-sodium). Angie Fava. Olives. Fruits  Canned fruit in a light or heavy syrup. Fried fruit. Fruit in cream or butter sauce. Meat and other protein foods  Fatty cuts of meat. Ribs. Fried meat. Berniece Salines. Sausage. Bologna and other processed lunch meats. Salami. Fatback. Hotdogs. Bratwurst. Salted nuts and seeds. Canned beans with added salt. Canned or smoked fish. Whole eggs or egg yolks. Chicken or [place] with skin. Dairy  Whole or 2% milk, cream, and half-and-half. Whole or full-fat cream cheese. Whole-fat or sweetened yogurt. Full-fat cheese. Nondairy creamers. Whipped toppings. Processed cheese and cheese spreads. Fats and oils  Butter. Stick margarine. Lard. Shortening. Ghee. Bacon fat. Tropical oils, such as coconut, palm kernel, or palm oil. Seasoning and other foods  Salted popcorn and pretzels. Onion salt, garlic salt, seasoned salt, table salt, and sea salt. Worcestershire sauce. Tartar sauce. Barbecue sauce. Teriyaki sauce. Soy sauce, including reduced-sodium. Steak sauce. Canned and packaged gravies. Fish sauce. Oyster sauce. Cocktail sauce. Horseradish that you find on the shelf. Ketchup. Mustard. Meat flavorings and tenderizers. Bouillon cubes. Hot sauce and Tabasco sauce. Premade or packaged marinades. Premade or packaged taco seasonings. Relishes. Regular salad dressings. Where to find more information:  National Heart, Lung, and Blood Institute: https://wilson-eaton.com/  American  Heart Association: www.heart.org Summary  The DASH eating plan is a healthy eating plan that has been shown to reduce high blood pressure (hypertension). It may also reduce your risk for type 2 diabetes, heart disease, and stroke.  With the DASH eating plan, you should limit salt (sodium) intake to 2,300 mg a day. If you have hypertension, you may need to reduce your sodium intake to 1,500 mg a day.  When on the DASH eating plan, aim to eat more fresh fruits and vegetables, whole grains, lean  proteins, low-fat dairy, and heart-healthy fats.  Work with your health care provider or diet and nutrition specialist (dietitian) to adjust your eating plan to your individual calorie needs. This information is not intended to replace advice given to you by your health care provider. Make sure you discuss any questions you have with your health care provider. Document Released: 04/08/2011 Document Revised: 04/12/2016 Document Reviewed: 04/12/2016 Elsevier Interactive Patient Education  2017 [employer]

## 2016-07-05 NOTE — Assessment & Plan Note (Signed)
Blood pressure below goal 140/90 with current regimen and no adverse side effects. Denies worse headache of life with no new symptoms of end organ damage done physical exam. Encouraged to monitor blood pressure, follow low-sodium diet. Continue current dosage of amlodipine.

## 2016-07-05 NOTE — Progress Notes (Signed)
Subjective:    Patient ID: Eric Owens, male    DOB: 27-Feb-1976, 41 y.o.   MRN: [id number]  Chief Complaint  Patient presents with  . Follow-up    blood pressure    HPI:  Eric Owens is a 41 y.o. male who  has a past medical history of Heart murmur; HTN (hypertension); Kidney stones; and OSA (obstructive sleep apnea). and presents today for a follow up office visit.   Hypertension - Recently started on amlodipine. Reports taking the medication as prescribed and denies adverse side effects. No currently checking his blood pressure at home. Denies worst headache of life or symptoms of end organ damage. Working on a low sodium intake. Overall feels good.    BP Readings from Last 3 Encounters:  07/05/16 122/80  06/07/16 128/90  05/13/16 128/86     No Known Allergies    Outpatient Medications Prior to Visit  Medication Sig Dispense Refill  . amLODipine (NORVASC) 10 MG tablet Take 1 tablet (10 mg total) by mouth daily. Must keep 07/05/16 appt for future refills 30 tablet 0  . amoxicillin (AMOXIL) 875 MG tablet Take 1 tablet (875 mg total) by mouth 2 (two) times daily. 20 tablet 0   No facility-administered medications prior to visit.      Review of Systems  Constitutional: Negative for chills and fever.  Eyes:       Negative for changes in vision  Respiratory: Negative for cough, chest tightness and wheezing.   Cardiovascular: Negative for chest pain, palpitations and leg swelling.  Neurological: Negative for dizziness, weakness and light-headedness.      Objective:    BP 122/80 (BP Location: Left Arm, Patient Position: Sitting, Cuff Size: Large)   Pulse 84   Temp 98 F (36.7 C) (Oral)   Resp 16   Ht 6\' 2"  (1.88 m)   Wt 200 lb (90.7 kg)   SpO2 97%   BMI 25.68 kg/m  Nursing note and vital signs reviewed.  Physical Exam  Constitutional: He is oriented to person, place, and time. He appears well-developed and well-nourished. No distress.  Cardiovascular: Normal  rate, regular rhythm, normal heart sounds and intact distal pulses.   Pulmonary/Chest: Effort normal and breath sounds normal.  Neurological: He is alert and oriented to person, place, and time.  Skin: Skin is warm and dry.  Psychiatric: He has a normal mood and affect. His behavior is normal. Judgment and thought content normal.       Assessment & Plan:   Problem List Items Addressed This Visit      Cardiovascular and Mediastinum   Essential hypertension    Blood pressure below goal 140/90 with current regimen and no adverse side effects. Denies worse headache of life with no new symptoms of end organ damage done physical exam. Encouraged to monitor blood pressure, follow low-sodium diet. Continue current dosage of amlodipine.      Relevant Medications   amLODipine (NORVASC) 10 MG tablet       I have discontinued Mr. Benevides's amoxicillin. I have also changed his amLODipine.   Meds ordered this encounter  Medications  . amLODipine (NORVASC) 10 MG tablet    Sig: Take 1 tablet (10 mg total) by mouth daily.    Dispense:  90 tablet    Refill:  1    Order Specific Question:   Supervising Provider    Answer:   Hillard DankerRAWFORD, ELIZABETH A [[id number]]     Follow-up: Return if symptoms worsen or fail to improve.  Mauricio Po, FNP

## 2017-02-14 ENCOUNTER — Other Ambulatory Visit: Payer: Self-pay | Admitting: Family

## 2017-02-14 DIAGNOSIS — I10 Essential (primary) hypertension: Secondary | ICD-10-CM

## 2017-02-15 NOTE — Telephone Encounter (Signed)
Sent 30 day only until pt come in for appt...Raechel Chute

## 2017-02-22 ENCOUNTER — Telehealth: Payer: Self-pay | Admitting: Family

## 2017-02-22 ENCOUNTER — Encounter: Payer: Self-pay | Admitting: Family Medicine

## 2017-02-22 ENCOUNTER — Ambulatory Visit (INDEPENDENT_AMBULATORY_CARE_PROVIDER_SITE_OTHER): Payer: BLUE CROSS/BLUE SHIELD | Admitting: Family Medicine

## 2017-02-22 VITALS — BP 146/82 | HR 84 | Temp 98.8°F | Ht 74.0 in | Wt 197.0 lb

## 2017-02-22 DIAGNOSIS — M545 Low back pain, unspecified: Secondary | ICD-10-CM | POA: Insufficient documentation

## 2017-02-22 NOTE — Patient Instructions (Signed)
Thank you for coming in,   Please try the exercises and you may need to modify your work place.  Review can take Tylenol or ibuprofen as needed if the pain worsens.    Please feel free to call with any questions or concerns at any time, at [phone]. --Dr. Jordan LikesSchmitz

## 2017-02-22 NOTE — Assessment & Plan Note (Signed)
Pain likely MSK in nature. Doesn't appear to be related to a kidney stone.  - counseled on exercises and stretches  - f/u PRN.

## 2017-02-22 NOTE — Progress Notes (Signed)
Eric Owens - 41 y.o. male MRN [id number]  Date of birth: Mar 15, 1976  SUBJECTIVE:  Including CC & ROS.  Chief Complaint  Patient presents with  . Back Pain    Located right mid back-present since yesterday-has not tried anything for the pain.    Eric Owens is a 41 y.o. male that is presenting with low back pain. The pain is occurring in the bilateral paraspinal muscles in the lower back. He denies any radicular symptoms. The pain has been ongoing for 1 day. Has not had any injury or trauma. Denies taking any medications for the pain. The pain seems be intermittent in nature. Denies any pain with shortness of breath. Has had normal bowel movements. He felt that he was abnormally gassy yesterday. The pain does not feel like the pain felt with a kidney stone.  Review of the CT abdomen pelvis from 2011 shows a small left UVJ calculus causing a mild left obstructive uropathy.  Review of his CT pelvis from 2006 shows tiny punctate calculus at the right UVJ.   Review of Systems  Musculoskeletal: Positive for back pain. Negative for gait problem.  Skin: Negative for color change.  Neurological: Negative for weakness and numbness.    HISTORY: Past Medical, Surgical, Social, and Family History Reviewed & Updated per EMR.   Pertinent Historical Findings include:  Past Medical History:  Diagnosis Date  . Heart murmur    Childhood  . HTN (hypertension)   . Kidney stones   . OSA (obstructive sleep apnea)     Past Surgical History:  Procedure Laterality Date  . FRACTURE SURGERY     Left Hand  . FRACTURE SURGERY     left hand    No Known Allergies  Family History  Problem Relation Age of Onset  . Heart disease Father   . Hypertension Father   . Hypertension Sister   . Heart disease Paternal Grandfather   . Hypertension Sister   . Hypertension Sister      Social History   Social History  . Marital status: Married    Spouse name: N/A  . Number of children: 3  . Years of  education: 14   Occupational History  . Programmer, multimedia    Social History Main Topics  . Smoking status: Current Every Day Smoker    Packs/day: 1.00    Years: 10.00    Types: Cigarettes  . Smokeless tobacco: Never Used  . Alcohol use 1.2 - 1.8 oz/week    2 - 3 Shots of liquor per week  . Drug use: No  . Sexual activity: Not on file   Other Topics Concern  . Not on file   Social History Narrative   Fun: Shoot pool, basketball.    Denies religious beliefs effecting healthcare.      PHYSICAL EXAM:  VS: BP (!) 146/82 (BP Location: Left Arm, Patient Position: Sitting, Cuff Size: Normal)   Pulse 84   Temp 98.8 F (37.1 C) (Oral)   Ht 6\' 2"  (1.88 m)   Wt 197 lb (89.4 kg)   SpO2 100%   BMI 25.29 kg/m  Physical Exam Gen: NAD, alert, cooperative with exam, well-appearing ENT: normal lips, normal nasal mucosa,  Eye: normal EOM, normal conjunctiva and lids CV:  no edema, +2 pedal pulses   Resp: no accessory muscle use, non-labored,  Skin: no rashes, no areas of induration  Neuro: normal tone, normal sensation to touch Psych:  normal insight, alert and oriented MSK:  Back:  No swelling or ecchymosis  No TTP of the paraspinal muscles in lumbar, midline lumbar, GT  Normal hip flexion strength to resistance Normal knee flexion and extension  Normal gait  Negative SLR b/l  Neurovascularly intact      ASSESSMENT & PLAN:   Acute bilateral low back pain without sciatica Pain likely MSK in nature. Doesn't appear to be related to a kidney stone.  - counseled on exercises and stretches  - f/u PRN.

## 2017-02-22 NOTE — Telephone Encounter (Signed)
 [facility] - Client TELEPHONE ADVICE RECORD [facility]  Patient Name: Eric Owens  DOB: July 22, 1975    Initial Comment Caller has been having back pain, and feels like gas.    Nurse Assessment  Nurse: Laural BenesJohnson, RN, Dondra SpryGail Date/Time Lamount Cohen(Eastern Time): 02/22/2017 8:43:46 AM  Confirm and document reason for call. If symptomatic, describe symptoms. ---[name] is having pain in right back feels like gas or something irritating onset yesterday does not hurt to breath. onset when he woke up yesterday am  Does the patient have any new or worsening symptoms? ---Yes  Will a triage be completed? ---Yes  Related visit to physician within the last 2 weeks? ---No  Does the PT have any chronic conditions? (i.e. diabetes, asthma, etc.) ---Yes  List chronic conditions. ---HTN  Is this a behavioral health or substance abuse call? ---No     Guidelines    Guideline Title Affirmed Question Affirmed Notes  Back Pain Back pain    Final Disposition User   See Physician within 24 Hours Tierra GrandeJohnson, RN, Dondra SpryGail    Comments  Dr. Jolene SchimkeJermey Schmitz, MD 1030am appt given for c/o back discomfort and gassy   Referrals  REFERRED TO PCP OFFICE

## 2017-03-10 ENCOUNTER — Encounter: Payer: Self-pay | Admitting: Internal Medicine

## 2017-03-10 ENCOUNTER — Ambulatory Visit (INDEPENDENT_AMBULATORY_CARE_PROVIDER_SITE_OTHER): Payer: BLUE CROSS/BLUE SHIELD | Admitting: Internal Medicine

## 2017-03-10 VITALS — BP 144/98 | HR 80 | Temp 98.8°F | Ht 74.0 in | Wt 199.0 lb

## 2017-03-10 DIAGNOSIS — E876 Hypokalemia: Secondary | ICD-10-CM | POA: Insufficient documentation

## 2017-03-10 DIAGNOSIS — F17200 Nicotine dependence, unspecified, uncomplicated: Secondary | ICD-10-CM | POA: Insufficient documentation

## 2017-03-10 DIAGNOSIS — Z0000 Encounter for general adult medical examination without abnormal findings: Secondary | ICD-10-CM

## 2017-03-10 DIAGNOSIS — I10 Essential (primary) hypertension: Secondary | ICD-10-CM | POA: Diagnosis not present

## 2017-03-10 DIAGNOSIS — N200 Calculus of kidney: Secondary | ICD-10-CM | POA: Insufficient documentation

## 2017-03-10 MED ORDER — AMLODIPINE BESYLATE 10 MG PO TABS: 10.0000 mg | ORAL_TABLET | Freq: Every day | ORAL | 3 refills | Status: DC

## 2017-03-10 NOTE — Assessment & Plan Note (Signed)
stable overall by history and exam, recent data reviewed with pt, and pt to continue medical treatment as before,  to f/u any worsening symptoms or concerns  

## 2017-03-10 NOTE — Assessment & Plan Note (Signed)

## 2017-03-10 NOTE — Patient Instructions (Signed)
Please continue all other medications as before, and refills have been done if requested.  Please have the pharmacy call with any other refills you may need.  Please continue your efforts at being more active, low cholesterol diet, and weight control.  You are otherwise up to date with prevention measures today.  Please keep your appointments with your specialists as you may have planned  Please return in 1 year for your yearly visit, or sooner if needed, with Lab testing done 3-5 days before  

## 2017-03-10 NOTE — Assessment & Plan Note (Signed)
declines chantix, ok for otc nicotine replacement

## 2017-03-10 NOTE — Progress Notes (Signed)
Subjective:    Patient ID: Eric Owens, male    DOB: 1976/03/31, 41 y.o.   MRN: [id number]  HPI  Here for wellness and f/u;  Overall doing ok;  Pt denies Chest pain, worsening SOB, DOE, wheezing, orthopnea, PND, worsening LE edema, palpitations, dizziness or syncope.  Pt denies neurological change such as new headache, facial or extremity weakness.  Pt denies polydipsia, polyuria, or low sugar symptoms. Pt states overall good compliance with treatment and medications, good tolerability, and has been trying to follow appropriate diet.  Pt denies worsening depressive symptoms, suicidal ideation or panic. No fever, night sweats, wt loss, loss of appetite, or other constitutional symptoms.  Pt states good ability with ADL's, has low fall risk, home safety reviewed and adequate, no other significant changes in hearing or vision, and only occasionally active with exercise.  Declines flu shot for now. Bp at home < 140/90.  No other interval hx Past Medical History:  Diagnosis Date  . Heart murmur    Childhood  . HTN (hypertension)   . Kidney stones   . OSA (obstructive sleep apnea)    Past Surgical History:  Procedure Laterality Date  . FRACTURE SURGERY     Left Hand  . FRACTURE SURGERY     left hand    reports that he has been smoking cigarettes.  He has a 10.00 pack-year smoking history. he has never used smokeless tobacco. He reports that he drinks about 1.2 - 1.8 oz of alcohol per week. He reports that he does not use drugs. family history includes Heart disease in his father and paternal grandfather; Hypertension in his father, sister, sister, and sister. No Known Allergies No current outpatient medications on file prior to visit.   No current facility-administered medications on file prior to visit.    Review of Systems  VS noted,  Constitutional: Pt is oriented to person, place, and time. Appears well-developed and well-nourished, in no significant distress and comfortable Head:  Normocephalic and atraumatic  Eyes: Conjunctivae and EOM are normal. Pupils are equal, round, and reactive to light Right Ear: External ear normal without discharge Left Ear: External ear normal without discharge Nose: Nose without discharge or deformity Mouth/Throat: Oropharynx is without other ulcerations and moist  Neck: Normal range of motion. Neck supple. No JVD present. No tracheal deviation present or significant neck LA or mass Cardiovascular: Normal rate, regular rhythm, normal heart sounds and intact distal pulses.   Pulmonary/Chest: WOB normal and breath sounds without rales or wheezing  Abdominal: Soft. Bowel sounds are normal. NT. No HSM  Musculoskeletal: Normal range of motion. Exhibits no edema Lymphadenopathy: Has no other cervical adenopathy.  Neurological: Pt is alert and oriented to person, place, and time. Pt has normal reflexes. No cranial nerve deficit. Motor grossly intact, Gait intact Skin: Skin is warm and dry. No rash noted or new ulcerations Psychiatric:  Has normal mood and affect. Behavior is normal without agitation All other system neg per pt    Objective:   Physical Exam BP (!) 144/98   Pulse 80   Temp 98.8 F (37.1 C) (Oral)   Ht 6\' 2"  (1.88 m)   Wt 199 lb (90.3 kg)   SpO2 99%   BMI 25.55 kg/m  VS noted,  Constitutional: Pt is oriented to person, place, and time. Appears well-developed and well-nourished, in no significant distress and comfortable Head: Normocephalic and atraumatic  Eyes: Conjunctivae and EOM are normal. Pupils are equal, round, and reactive to light Right  Ear: External ear normal without discharge Left Ear: External ear normal without discharge Nose: Nose without discharge or deformity Mouth/Throat: Oropharynx is without other ulcerations and moist  Neck: Normal range of motion. Neck supple. No JVD present. No tracheal deviation present or significant neck LA or mass Cardiovascular: Normal rate, regular rhythm, normal heart  sounds and intact distal pulses.   Pulmonary/Chest: WOB normal and breath sounds without rales or wheezing  Abdominal: Soft. Bowel sounds are normal. NT. No HSM  Musculoskeletal: Normal range of motion. Exhibits no edema Lymphadenopathy: Has no other cervical adenopathy.  Neurological: Pt is alert and oriented to person, place, and time. Pt has normal reflexes. No cranial nerve deficit. Motor grossly intact, Gait intact Skin: Skin is warm and dry. No rash noted or new ulcerations Psychiatric:  Has normal mood and affect. Behavior is normal without agitation No other system neg per pt  Lab Results  Component Value Date   WBC 24.6 (A) 04/22/2016   HGB 14.3 04/22/2016   HCT 41.2 (A) 04/22/2016   PLT 383 10/01/2015   GLUCOSE 94 05/13/2016   ALT 44 10/01/2015   AST 41 10/01/2015   NA 139 05/13/2016   K 3.1 (L) 05/13/2016   CL 99 05/13/2016   CREATININE 0.89 05/13/2016   BUN 11 05/13/2016   CO2 32 05/13/2016   INR 0.95 10/01/2015       Assessment & Plan:

## 2017-03-23 ENCOUNTER — Telehealth: Payer: Self-pay | Admitting: Family

## 2017-03-23 DIAGNOSIS — I10 Essential (primary) hypertension: Secondary | ICD-10-CM

## 2017-03-23 MED ORDER — AMLODIPINE BESYLATE 10 MG PO TABS: 10.0000 mg | ORAL_TABLET | Freq: Every day | ORAL | 3 refills | Status: DC

## 2017-03-23 NOTE — Telephone Encounter (Signed)
Pt called stating that his pharmacy has not received his BP medication.

## 2017-03-23 NOTE — Telephone Encounter (Signed)
Med resent 

## 2017-08-15 ENCOUNTER — Telehealth: Payer: Self-pay | Admitting: Family

## 2017-08-15 DIAGNOSIS — I10 Essential (primary) hypertension: Secondary | ICD-10-CM

## 2017-08-15 MED ORDER — AMLODIPINE BESYLATE 10 MG PO TABS: 10.0000 mg | ORAL_TABLET | Freq: Every day | ORAL | 0 refills | Status: DC

## 2017-08-15 NOTE — Telephone Encounter (Signed)
Ret'd. Call to pt.  Stated he will fly home on Tuesday night.  Is requesting Amlodipine 10 mg., 2-3 pills to get through until he returns home.  Is completely out of his medication, and flight from [place], [place] was delayed.  Advised will contact [facility] on [address] re: his request.   Phone call to [place]; advised of pt's. situation above.  Spoke with "Marita".  Was given information that she will refill based on most cost effective way for pt.  Gave v.o. For Amlodipine 10 mg. Take 1 tab po. Qd; #30; no refills.    Ret'd call to pt. and advised of the above.  Agreed with plan.

## 2017-08-15 NOTE — Telephone Encounter (Signed)
Copied from CRM #[id number]. Topic: Quick Communication - See Telephone Encounter >> Aug 15, 2017 10:49 AM Aydian Dimmick L, NT wrote: CRM for notification. See Telephone encounter for: 08/15/17.  Patient is needing a refill on amLODipine (NORVASC) 10 MG tablet. He states his airplane was delayed and could not fly in. He states he only needs 2 or 3 pills. Please advise. Patient would like a call back. Please advise.  [facility] [address], [place] [address] Phone: [phone] 

## 2017-08-15 NOTE — Telephone Encounter (Deleted)
Copied from CRM [id number]. Topic: Quick Communication - See Telephone Encounter >> Aug 15, 2017 10:49 AM Windy KalataMichael, Eric Owens, Eric Owens wrote: CRM for notification. See Telephone encounter for: 08/15/17.  Patient is needing a refill on amLODipine (NORVASC) 10 MG tablet. He states his airplane was delayed and could not fly in. He states he only needs 2 or 3 pills. Please advise. Patient would like a call back. Please advise.  [facility] [address], [place], [place] [id number] Phone: [phone]

## 2017-08-15 NOTE — Telephone Encounter (Signed)
Patient was last seen by Dr Jonny RuizJohn on 11/18 to transfer care and have a physical. He was told to follow up in a year.

## 2017-08-15 NOTE — Telephone Encounter (Signed)
Review chart CPX w/Dr. Jonny RuizJohn on 03/10/17, and was told to f/u in 1 year. Sent medication to requested pharmacy.Marland Kitchen.Raechel Chute/lmb

## 2017-08-15 NOTE — Telephone Encounter (Signed)
Pt is overdue for appt. Last saw MD 07/2016 will need to make appt for medication refill.Marland Kitchen.Raechel Chute/lmb

## 2017-09-22 ENCOUNTER — Encounter: Payer: Self-pay | Admitting: Family Medicine

## 2017-10-10 ENCOUNTER — Encounter: Payer: Self-pay | Admitting: Internal Medicine

## 2017-10-10 ENCOUNTER — Ambulatory Visit: Payer: BLUE CROSS/BLUE SHIELD | Admitting: Internal Medicine

## 2017-10-10 VITALS — BP 138/90 | HR 96 | Temp 98.7°F | Ht 74.0 in | Wt 197.0 lb

## 2017-10-10 DIAGNOSIS — G4733 Obstructive sleep apnea (adult) (pediatric): Secondary | ICD-10-CM | POA: Diagnosis not present

## 2017-10-10 DIAGNOSIS — F17200 Nicotine dependence, unspecified, uncomplicated: Secondary | ICD-10-CM | POA: Diagnosis not present

## 2017-10-10 DIAGNOSIS — I10 Essential (primary) hypertension: Secondary | ICD-10-CM | POA: Diagnosis not present

## 2017-10-10 DIAGNOSIS — J029 Acute pharyngitis, unspecified: Secondary | ICD-10-CM | POA: Diagnosis not present

## 2017-10-10 MED ORDER — AMOXICILLIN 500 MG PO CAPS: 1000.0000 mg | ORAL_CAPSULE | Freq: Two times a day (BID) | ORAL | 0 refills | Status: DC

## 2017-10-10 NOTE — Assessment & Plan Note (Signed)
Urged to quit, declines chantix 

## 2017-10-10 NOTE — Assessment & Plan Note (Signed)
Good compliance with cpap,  to f/u any worsening symptoms or concerns

## 2017-10-10 NOTE — Progress Notes (Signed)
   Subjective:    Patient ID: Eric Owens, male    DOB: 06-19-1975, 42 y.o.   MRN: [id number]  HPI  Here with 2-3 days acute onset fever, severe ST pain, pressure, headache, general weakness and malaise, and greenish d/c, and cough, but pt denies chest pain, wheezing, increased sob or doe, orthopnea, PND, increased LE swelling, palpitations, dizziness or syncope. Pt denies new neurological symptoms such as new headache, or facial or extremity weakness or numbness   Pt denies polydipsia, polyuria.  Good compliance with CPAP.  Still smoking, not ready to quit Past Medical History:  Diagnosis Date  . Heart murmur    Childhood  . HTN (hypertension)   . Kidney stones   . OSA (obstructive sleep apnea)    Past Surgical History:  Procedure Laterality Date  . FRACTURE SURGERY     Left Hand  . FRACTURE SURGERY     left hand    reports that he has been smoking cigarettes.  He has a 10.00 pack-year smoking history. He has never used smokeless tobacco. He reports that he drinks about 1.2 - 1.8 oz of alcohol per week. He reports that he does not use drugs. family history includes Heart disease in his father and paternal grandfather; Hypertension in his father, sister, sister, and sister. No Known Allergies Current Outpatient Medications on File Prior to Visit  Medication Sig Dispense Refill  . amLODipine (NORVASC) 10 MG tablet Take 1 tablet (10 mg total) by mouth daily. 30 tablet 0   No current facility-administered medications on file prior to visit.    Review of Systems  Constitutional: Negative for other unusual diaphoresis or sweats HENT: Negative for ear discharge or swelling Eyes: Negative for other worsening visual disturbances Respiratory: Negative for stridor or other swelling  Gastrointestinal: Negative for worsening distension or other blood Genitourinary: Negative for retention or other urinary change Musculoskeletal: Negative for other MSK pain or swelling Skin: Negative for color  change or other new lesions Neurological: Negative for worsening tremors and other numbness  Psychiatric/Behavioral: Negative for worsening agitation or other fatigue All other system neg per pt    Objective:   Physical Exam BP 138/90   Pulse 96   Temp 98.7 F (37.1 C) (Oral)   Ht 6\' 2"  (1.88 m)   Wt 197 lb (89.4 kg)   SpO2 100%   BMI 25.29 kg/m  VS noted, mild ill Constitutional: Pt appears in NAD HENT: Head: NCAT.  Right Ear: External ear normal.  Left Ear: External ear normal.  Bilat tm's with mild erythema.  Max sinus areas non tender.  Pharynx with severe erythema and tonsillar hypertrophy, no exudate Eyes: . Pupils are equal, round, and reactive to light. Conjunctivae and EOM are normal Nose: without d/c or deformity Neck: Neck supple. Gross normal ROM Cardiovascular: Normal rate and regular rhythm.   Pulmonary/Chest: Effort normal and breath sounds without rales or wheezing.  Abd:  Soft, NT, ND, + BS, no organomegaly Neurological: Pt is alert. At baseline orientation, motor grossly intact Skin: Skin is warm. No rashes, other new lesions, no LE edema Psychiatric: Pt behavior is normal without agitation  No other exam findings    Assessment & Plan:

## 2017-10-10 NOTE — Assessment & Plan Note (Signed)
Mild to mod, for antibx course,  to f/u any worsening symptoms or concerns 

## 2017-10-10 NOTE — Patient Instructions (Addendum)
Please take all new medication as prescribed - the antibiotic  Please continue all other medications as before, and refills have been done if requested.  Please have the pharmacy call with any other refills you may need.  Please keep your appointments with your specialists as you may have planned  Please return in 5 months for your yearly visit, or sooner if needed, with Lab testing done 3-5 days before

## 2017-10-10 NOTE — Assessment & Plan Note (Signed)
stable overall by history and exam, recent data reviewed with pt, and pt to continue medical treatment as before,  to f/u any worsening symptoms or concerns BP Readings from Last 3 Encounters:  10/10/17 138/90  03/10/17 (!) 144/98  02/22/17 (!) 146/82

## 2017-10-13 ENCOUNTER — Telehealth: Payer: Self-pay | Admitting: Internal Medicine

## 2017-10-13 NOTE — Telephone Encounter (Signed)
Copied from CRM [id number]. Topic: Quick Communication - See Telephone Encounter >> Oct 13, 2017  5:32 PM Floria RavelingStovall, Shana A wrote: CRM for notification. See Telephone encounter for: 10/13/17. Pt called in and stated that the swelling of his tonsils have not went down and would like prednisone called in to help.  He was just in office on Monday   Pharmacy -[facility] on [address]

## 2017-10-14 MED ORDER — PREDNISONE 10 MG PO TABS: ORAL_TABLET | ORAL | 0 refills | Status: DC

## 2017-10-14 NOTE — Telephone Encounter (Signed)
Done erx 

## 2017-10-14 NOTE — Telephone Encounter (Signed)
Pt has been informed.

## 2017-11-10 IMAGING — CT CT HEAD W/O CM
3 series · 15 of 47 positions shown, 18 images · non-contrast
Comparison: None.

CLINICAL DATA: One day history of left upper extremity numbness

EXAM:
CT HEAD WITHOUT CONTRAST
TECHNIQUE: Contiguous axial images were obtained from the base of the skull
through the vertex without intravenous contrast.

[Series 2: head wo · axial · 0.43mm/px · z∈[-167,-37]mm · 9 of 32 slices shown, 12 images]
[im 3/32  brain]
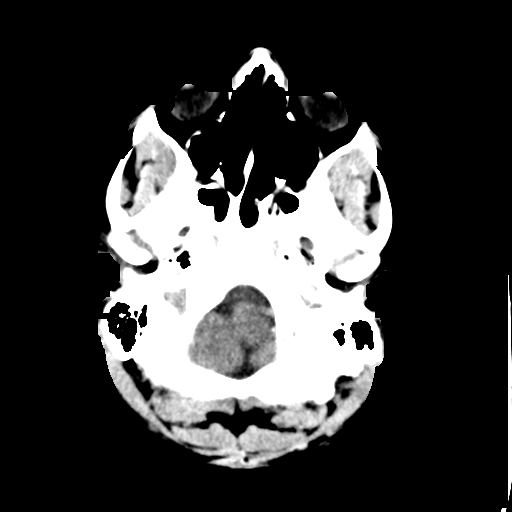
[im 3/32  bone]
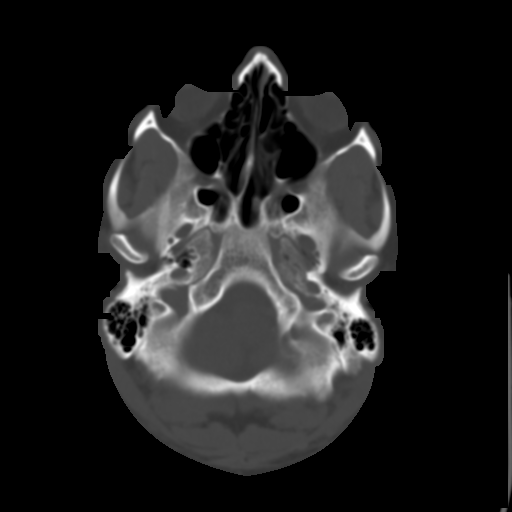
[im 6/32  brain]
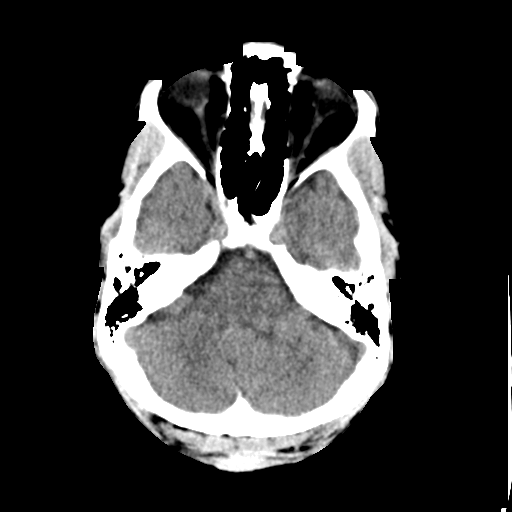
[im 9/32  brain]
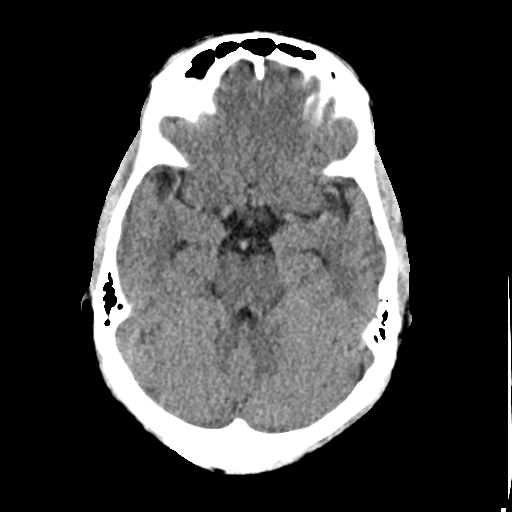
[im 12/32  brain]
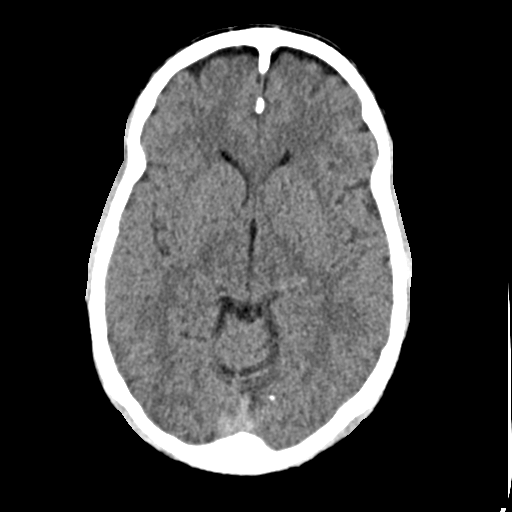
[im 17/32  brain]
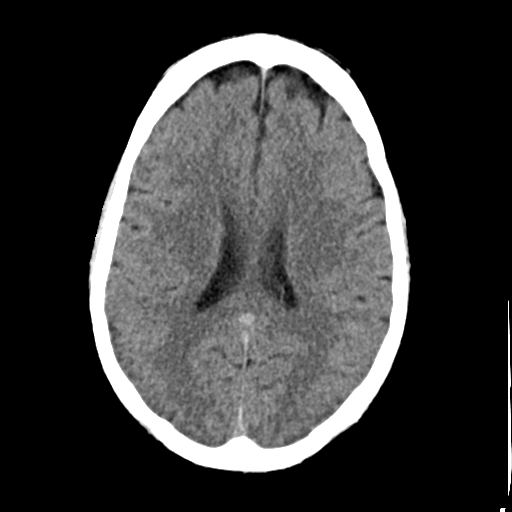
[im 17/32  bone]
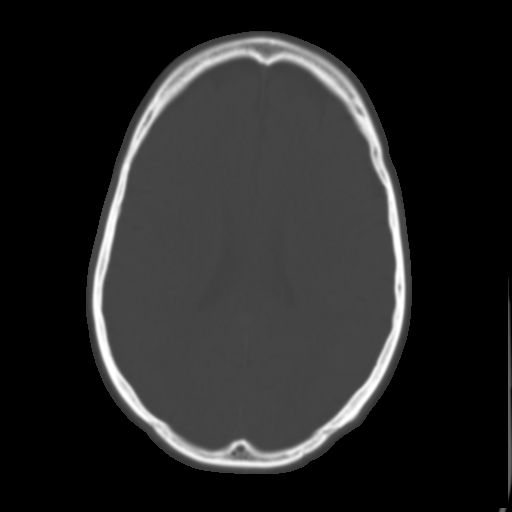
[im 20/32  brain]
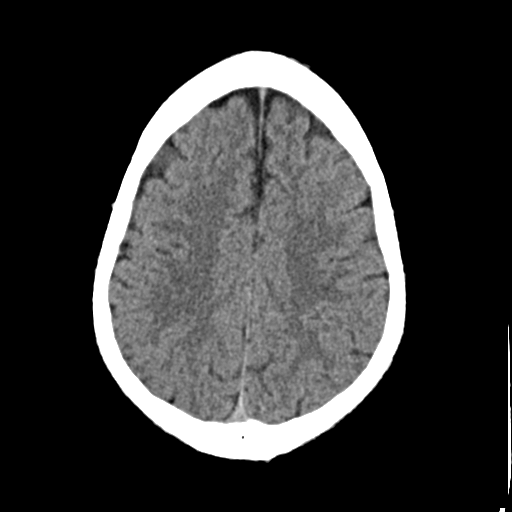
[im 23/32  brain]
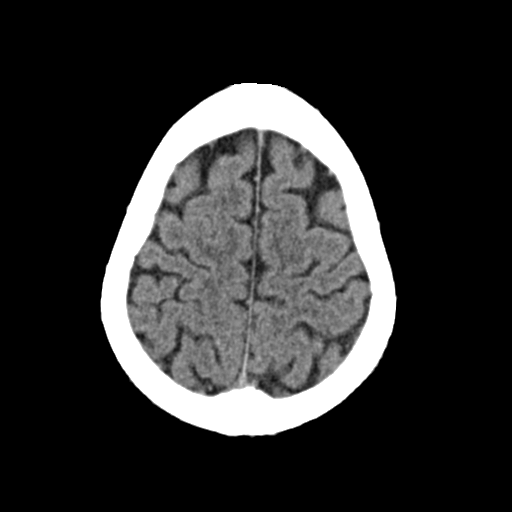
[im 26/32  brain]
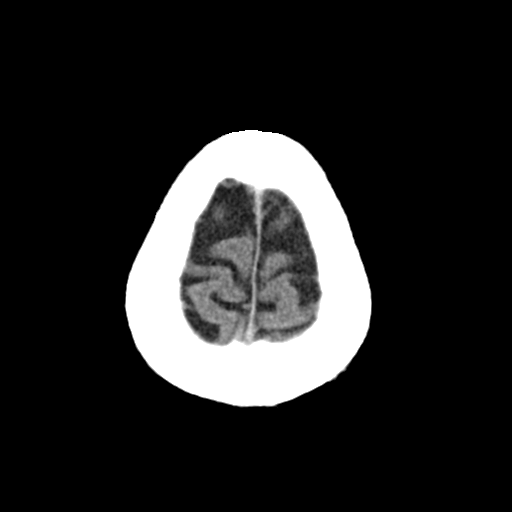
[im 29/32  brain]
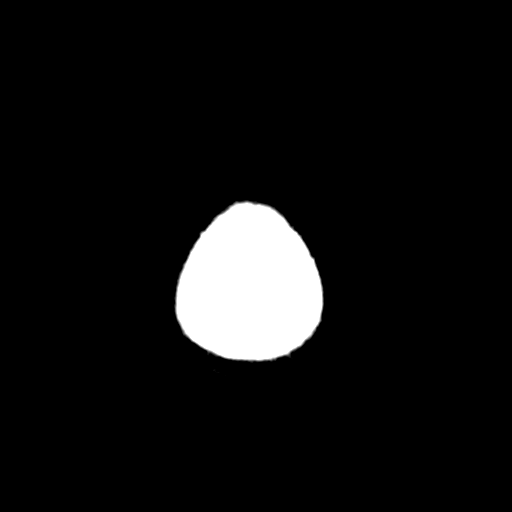
[im 29/32  bone]
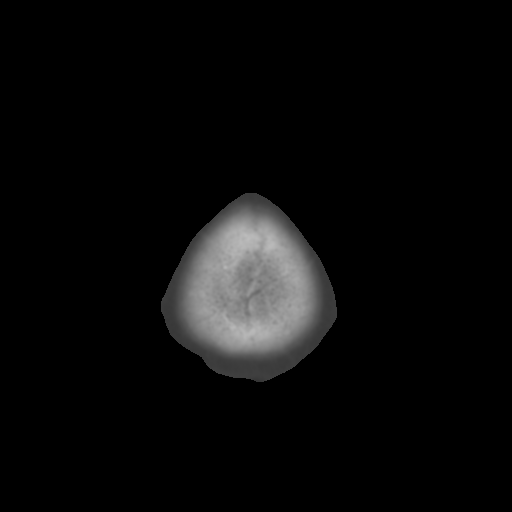

[Series 4: coronal soft · coronal · 0.32mm/px · 3 of 41 slices shown]
[im 14/41  brain]
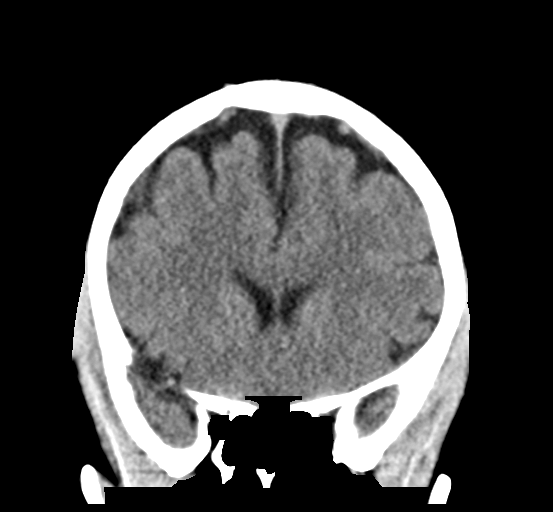
[im 18/41  brain]
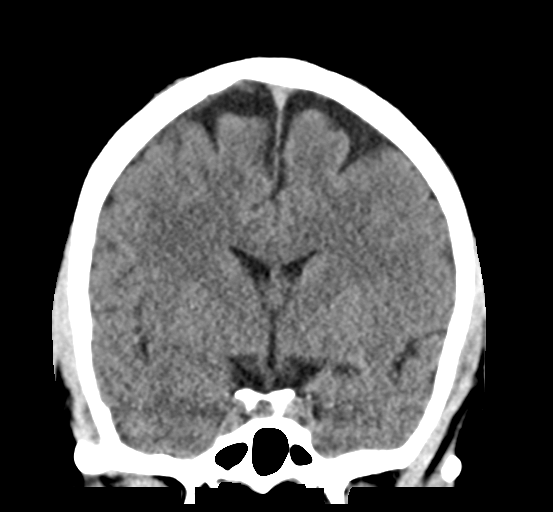
[im 23/41  brain]
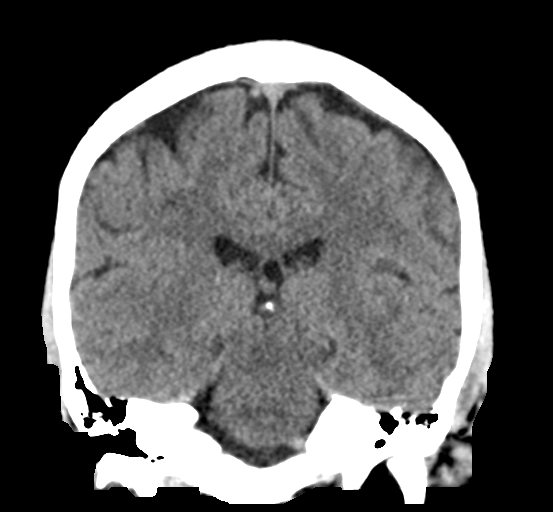

[Series 5: sag soft · sagittal · 0.31mm/px · 3 of 32 slices shown]
[im 11/32  brain]
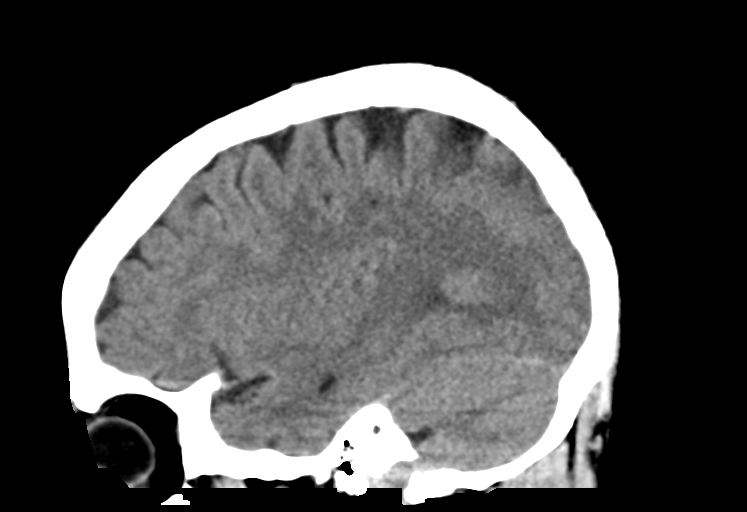
[im 16/32  brain]
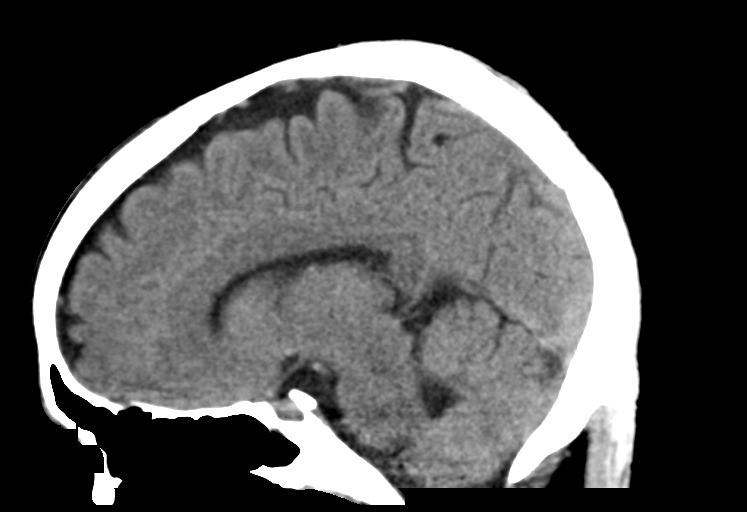
[im 21/32  brain]
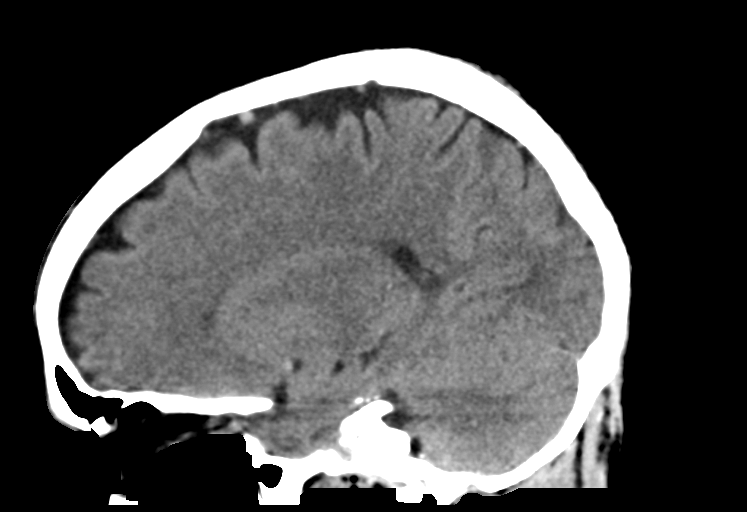

[15 of 47 positions shown; findings below may reference images not displayed]

FINDINGS: The ventricles are normal in size and configuration. There is no
intracranial mass, hemorrhage, extra-axial fluid collection, or
midline shift. The gray-white compartments are normal. No acute
infarct evident. The bony calvarium appears intact. The mastoid air
cells are clear. Orbits appear symmetric bilaterally.
IMPRESSION: Study within normal limits.

## 2017-11-22 ENCOUNTER — Other Ambulatory Visit: Payer: Self-pay

## 2017-11-22 ENCOUNTER — Encounter (HOSPITAL_BASED_OUTPATIENT_CLINIC_OR_DEPARTMENT_OTHER): Payer: Self-pay

## 2017-11-22 ENCOUNTER — Emergency Department (HOSPITAL_BASED_OUTPATIENT_CLINIC_OR_DEPARTMENT_OTHER)
Admission: EM | Admit: 2017-11-22 | Discharge: 2017-11-22 | Disposition: A | Discharge status: Home / Self Care | Payer: BLUE CROSS/BLUE SHIELD | Attending: Emergency Medicine | Admitting: Emergency Medicine

## 2017-11-22 ENCOUNTER — Emergency Department (HOSPITAL_BASED_OUTPATIENT_CLINIC_OR_DEPARTMENT_OTHER): Payer: BLUE CROSS/BLUE SHIELD

## 2017-11-22 DIAGNOSIS — F17210 Nicotine dependence, cigarettes, uncomplicated: Secondary | ICD-10-CM | POA: Diagnosis not present

## 2017-11-22 DIAGNOSIS — R0789 Other chest pain: Secondary | ICD-10-CM | POA: Diagnosis present

## 2017-11-22 DIAGNOSIS — I10 Essential (primary) hypertension: Secondary | ICD-10-CM | POA: Insufficient documentation

## 2017-11-22 DIAGNOSIS — Z79899 Other long term (current) drug therapy: Secondary | ICD-10-CM | POA: Insufficient documentation

## 2017-11-22 LAB — CBC WITH DIFFERENTIAL/PLATELET
Basophils Absolute: 0 10*3/uL (ref 0.0–0.1)
Basophils Relative: 0 %
Eosinophils Absolute: 0.1 10*3/uL (ref 0.0–0.7)
Eosinophils Relative: 1 %
HCT: 42.4 % (ref 39.0–52.0)
Hemoglobin: 14.5 g/dL (ref 13.0–17.0)
Lymphocytes Relative: 41 %
Lymphs Abs: 4.2 10*3/uL — ABNORMAL HIGH (ref 0.7–4.0)
MCH: 30.5 pg (ref 26.0–34.0)
MCHC: 34.2 g/dL (ref 30.0–36.0)
MCV: 89.3 fL (ref 78.0–100.0)
Monocytes Absolute: 0.8 10*3/uL (ref 0.1–1.0)
Monocytes Relative: 7 %
Neutro Abs: 5.2 10*3/uL (ref 1.7–7.7)
Neutrophils Relative %: 51 %
Platelets: 345 10*3/uL (ref 150–400)
RBC: 4.75 MIL/uL (ref 4.22–5.81)
RDW: 13.7 % (ref 11.5–15.5)
WBC: 10.3 10*3/uL (ref 4.0–10.5)

## 2017-11-22 LAB — COMPREHENSIVE METABOLIC PANEL
ALT: 29 U/L (ref 0–44)
AST: 27 U/L (ref 15–41)
Albumin: 4.1 g/dL (ref 3.5–5.0)
Alkaline Phosphatase: 64 U/L (ref 38–126)
Anion gap: 10 (ref 5–15)
BUN: 17 mg/dL (ref 6–20)
CO2: 25 mmol/L (ref 22–32)
Calcium: 9.3 mg/dL (ref 8.9–10.3)
Chloride: 103 mmol/L (ref 98–111)
Creatinine, Ser: 1.02 mg/dL (ref 0.61–1.24)
GFR calc Af Amer: >60 mL/min (ref >60)
GFR calc non Af Amer: >60 mL/min (ref >60)
Glucose, Bld: 93 mg/dL (ref 70–99)
Potassium: 3.2 mmol/L — ABNORMAL LOW (ref 3.5–5.1)
Sodium: 138 mmol/L (ref 135–145)
Total Bilirubin: 0.3 mg/dL (ref 0.3–1.2)
Total Protein: 8 g/dL (ref 6.5–8.1)

## 2017-11-22 LAB — TROPONIN I
Troponin I: <0.03 ng/mL (ref <0.03)
Troponin I: <0.03 ng/mL (ref <0.03)

## 2017-11-22 LAB — LIPASE, BLOOD: Lipase: 44 U/L (ref 11–51)

## 2017-11-22 MED ORDER — POTASSIUM CHLORIDE CRYS ER 20 MEQ PO TBCR
40.0000 meq | EXTENDED_RELEASE_TABLET | Freq: Once | ORAL | Status: AC
Start: 2017-11-22 — End: 2017-11-22
  Administered 2017-11-22: 40 meq via ORAL
  Filled 2017-11-22: qty 2

## 2017-11-22 NOTE — Discharge Instructions (Signed)
Stop smoking.  Please follow-up with family doctor if continued to have chest pain.  Return if any worsening symptoms.

## 2017-11-22 NOTE — ED Triage Notes (Signed)
C/o CP X 2 days-steady gait

## 2017-11-22 NOTE — ED Provider Notes (Signed)
[facility] EMERGENCY DEPARTMENT Provider Note   CSN: [id number] Arrival date & time: 11/22/17  1641     History   Chief Complaint Chief Complaint  Patient presents with  . Chest Pain    HPI Eric Owens is a 42 y.o. male.  HPI Eric Owens is a 42 y.o. male with history of hypertension, presents to the emergency department with complaint of chest pain. Patient states that he has had chest pressure left sided chest for the last two days. Patient states the pain is mostly at nighttime and in the morning. He states sometimes he wakes them up for sleep. He describes pain as big discomfort. He denies pain radiating anywhere. Denies any abdominal pain. Pain is not exertional, and he does not have it when walking up or down the stairs. Denies associated dizziness, nausea, shortness of breath. It is not worsened with eating. He does however describe some belching and burning sensation after he eats into his throat. He does admit to some anxiety. He reports that one of his buddies passed away in his sleep from heart failure. He has not taken any medications for this. He does have history of hypertension and he does smoke. No prior cardiac workup other than one visit to the ED in the past.  Past Medical History:  Diagnosis Date  . Heart murmur    Childhood  . HTN (hypertension)   . Kidney stones   . OSA (obstructive sleep apnea)     Patient Active Problem List   Diagnosis Date Noted  . Acute pharyngitis 10/10/2017  . Hypokalemia 03/10/2017  . Smoker 03/10/2017  . Kidney stones   . Acute bilateral low back pain without sciatica 02/22/2017  . Essential hypertension 01/23/2015  . Routine general medical examination at a health care facility 09/17/2014  . OSA (obstructive sleep apnea) 09/17/2014    Past Surgical History:  Procedure Laterality Date  . FRACTURE SURGERY     Left Hand  . FRACTURE SURGERY     left hand        Home Medications    Prior to Admission  medications   Medication Sig Start Date End Date Taking? Authorizing Provider  amLODipine (NORVASC) 10 MG tablet Take 1 tablet (10 mg total) by mouth daily. 08/15/17   Corwin Levins, MD  amoxicillin (AMOXIL) 500 MG capsule Take 2 capsules (1,000 mg total) by mouth 2 (two) times daily. 10/10/17   Corwin Levins, MD  predniSONE (DELTASONE) 10 MG tablet 2 tabs by mouth per day for 5 days 10/14/17   Corwin Levins, MD    Family History Family History  Problem Relation Age of Onset  . Heart disease Father   . Hypertension Father   . Hypertension Sister   . Heart disease Paternal Grandfather   . Hypertension Sister   . Hypertension Sister     Social History Social History   Tobacco Use  . Smoking status: Current Every Day Smoker    Packs/day: 1.00    Years: 10.00    Pack years: 10.00    Types: Cigarettes  . Smokeless tobacco: Never Used  Substance Use Topics  . Alcohol use: Yes    Comment: weekends  . Drug use: No     Allergies   Patient has no known allergies.   Review of Systems Review of Systems  Constitutional: Negative for chills and fever.  Respiratory: Positive for chest tightness. Negative for cough and shortness of breath.   Cardiovascular: Positive for  chest pain. Negative for palpitations and leg swelling.  Gastrointestinal: Negative for abdominal distention, abdominal pain, diarrhea, nausea and vomiting.  Genitourinary: Negative for dysuria, frequency, hematuria and urgency.  Musculoskeletal: Negative for arthralgias, myalgias, neck pain and neck stiffness.  Skin: Negative for rash.  Allergic/Immunologic: Negative for immunocompromised state.  Neurological: Negative for dizziness, weakness, light-headedness, numbness and headaches.  All other systems reviewed and are negative.    Physical Exam Updated Vital Signs BP 132/88 (BP Location: Right Arm)   Pulse 97   Temp 98.5 F (36.9 C) (Oral)   Resp 18   Ht 6\' 2"  (1.88 m)   Wt 88.5 kg (195 lb)   SpO2 100%    BMI 25.04 kg/m   Physical Exam  Constitutional: He appears well-developed and well-nourished. No distress.  HENT:  Head: Normocephalic and atraumatic.  Eyes: Conjunctivae are normal.  Neck: Neck supple.  Cardiovascular: Normal rate, regular rhythm and normal heart sounds. Exam reveals no gallop and no friction rub.  No murmur heard. Pulmonary/Chest: Effort normal. No respiratory distress. He has no wheezes. He has no rales. He exhibits no tenderness.  Abdominal: Soft. Bowel sounds are normal. He exhibits no distension. There is no tenderness. There is no rebound.  Musculoskeletal: He exhibits no edema.  Neurological: He is alert.  Skin: Skin is warm and dry.  Nursing note and vitals reviewed.    ED Treatments / Results  Labs (all labs ordered are listed, but only abnormal results are displayed) Labs Reviewed  CBC WITH DIFFERENTIAL/PLATELET - Abnormal; Notable for the following components:      Result Value   Lymphs Abs 4.2 (*)    All other components within normal limits  COMPREHENSIVE METABOLIC PANEL - Abnormal; Notable for the following components:   Potassium 3.2 (*)    All other components within normal limits  LIPASE, BLOOD  TROPONIN I  TROPONIN I    EKG EKG Interpretation  Date/Time:  Tuesday November 22 2017 16:48:13 EDT Ventricular Rate:  96 PR Interval:  136 QRS Duration: 84 QT Interval:  356 QTC Calculation: 449 R Axis:   65 Text Interpretation:  Normal sinus rhythm Nonspecific T wave abnormality Abnormal ECG No STEMI.  Confirmed by Alona Bene [phone]) on 11/22/2017 4:53:26 PM   Radiology Dg Chest 2 View  Result Date: 11/22/2017 CLINICAL DATA:  Chest pain EXAM: CHEST - 2 VIEW COMPARISON:  None. FINDINGS: The heart size and mediastinal contours are within normal limits. Both lungs are clear. The visualized skeletal structures are unremarkable. IMPRESSION: No active cardiopulmonary disease. Electronically Signed   By: Marlan Palau M.D.   On: 11/22/2017  17:50    Procedures Procedures (including critical care time)  Medications Ordered in ED Medications - No data to display   Initial Impression / Assessment and Plan / ED Course  I have reviewed the triage vital signs and the nursing notes.  Pertinent labs & imaging results that were available during my care of the patient were reviewed by me and considered in my medical decision making (see chart for details).     Pt in ED with atypical chest pain. No pain at this time. Pt does have few risk factures for ACS including HTN and smoking. Otherwise heart score is 2. Will get ECG, CXR, blood tests  Initial troponin is negative.  Chest x-ray negative.  I will monitor patient and recheck delta troponin.  8:14 PM 3-hour troponin is normal.  Potassium replaced.  We will discharge patient home.  Vital signs  are normal.  No acute distress.  No active chest pain.  We will have patient follow-up with his primary care doctor.  Advised to return if worsening symptoms.  Vitals:   11/22/17 1800 11/22/17 1830 11/22/17 1900 11/22/17 1930  BP: (!) 132/95 (!) 129/94 124/89 125/90  Pulse: 87 86 83 73  Resp: (!) 23 19  (!) 21  Temp:      TempSrc:      SpO2: 97% 99% 97% 97%  Weight:      Height:         Final Clinical Impressions(s) / ED Diagnoses   Final diagnoses:  Atypical chest pain    ED Discharge Orders    None       Jaynie CrumbleKirichenko, Kely Dohn, PA-C 11/22/17 2245    Maia PlanLong, Joshua G, MD 11/23/17 1113

## 2017-11-22 NOTE — ED Notes (Signed)
Pt denies pain at this time. Pt c/o pain to L lower chest, upper abd that comes on at night. Pt describes the pain as an "uncomfortable" feeling. Pt denies onset during activity. Pt denies ShOB, nausea, or diaphoresis. Pt states he does eat late at night. Not reproducible to palpation.

## 2017-11-22 NOTE — ED Notes (Signed)
Pt on monitor 

## 2017-11-29 ENCOUNTER — Telehealth: Payer: Self-pay | Admitting: Internal Medicine

## 2017-11-29 NOTE — Telephone Encounter (Signed)
Copied from CRM [id number]. Topic: Quick Communication - See Telephone Encounter >> Nov 29, 2017  2:02 PM Oneal GroutSebastian, Jennifer S wrote: CRM for notification. See Telephone encounter for: 11/29/17. Patient seen in June for tonsillitis and prescribed prednisone, throat is sore again. Would Dr Jonny RuizJohn be willing to call in prednisone again? Made patient aware appt maybe needed.

## 2017-11-29 NOTE — Telephone Encounter (Unsigned)
Copied from CRM #[id number]. Topic: Quick Communication - See Telephone Encounter °>> Nov 29, 2017  2:02 PM Eric Owens, Jennifer S wrote: °CRM for notification. See Telephone encounter for: 11/29/17. Patient seen in June for tonsillitis and prescribed prednisone, throat is sore again. Would Dr John be willing to call in prednisone again? Made patient aware appt maybe needed.  ° °

## 2017-11-30 ENCOUNTER — Ambulatory Visit: Payer: BLUE CROSS/BLUE SHIELD | Admitting: Family

## 2017-11-30 ENCOUNTER — Encounter: Payer: Self-pay | Admitting: Family

## 2017-11-30 VITALS — BP 130/90 | HR 91 | Temp 98.1°F | Ht 74.0 in | Wt 193.0 lb

## 2017-11-30 DIAGNOSIS — J029 Acute pharyngitis, unspecified: Secondary | ICD-10-CM

## 2017-11-30 MED ORDER — PREDNISONE 20 MG PO TABS: 40.0000 mg | ORAL_TABLET | Freq: Every day | ORAL | 0 refills | Status: DC

## 2017-11-30 MED ORDER — AMOXICILLIN-POT CLAVULANATE 875-125 MG PO TABS: 1.0000 | ORAL_TABLET | Freq: Two times a day (BID) | ORAL | 0 refills | Status: DC

## 2017-11-30 MED ORDER — METHYLPREDNISOLONE ACETATE 40 MG/ML IJ SUSP
40.0000 mg | Freq: Once | INTRAMUSCULAR | Status: AC
  Administered 2017-11-30: 40 mg via INTRAMUSCULAR

## 2017-11-30 NOTE — Telephone Encounter (Signed)
Appointment made for 7/31.

## 2017-11-30 NOTE — Progress Notes (Signed)
Eric Owens is a 42 y.o. male with the following history as recorded in EpicCare:  Patient Active Problem List   Diagnosis Date Noted  . Acute pharyngitis 10/10/2017  . Hypokalemia 03/10/2017  . Smoker 03/10/2017  . Kidney stones   . Acute bilateral low back pain without sciatica 02/22/2017  . Essential hypertension 01/23/2015  . Routine general medical examination at a health care facility 09/17/2014  . OSA (obstructive sleep apnea) 09/17/2014    Current Outpatient Medications  Medication Sig Dispense Refill  . amLODipine (NORVASC) 10 MG tablet Take 1 tablet (10 mg total) by mouth daily. 30 tablet 0  . amoxicillin-clavulanate (AUGMENTIN) 875-125 MG tablet Take 1 tablet by mouth 2 (two) times daily. 20 tablet 0  . predniSONE (DELTASONE) 20 MG tablet Take 2 tablets (40 mg total) by mouth daily with breakfast. 10 tablet 0   No current facility-administered medications for this visit.     Allergies: Patient has no known allergies.  Past Medical History:  Diagnosis Date  . Heart murmur    Childhood  . HTN (hypertension)   . Kidney stones   . OSA (obstructive sleep apnea)     Past Surgical History:  Procedure Laterality Date  . FRACTURE SURGERY     Left Hand  . FRACTURE SURGERY     left hand    Family History  Problem Relation Age of Onset  . Heart disease Father   . Hypertension Father   . Hypertension Sister   . Heart disease Paternal Grandfather   . Hypertension Sister   . Hypertension Sister     Social History   Tobacco Use  . Smoking status: Current Every Day Smoker    Packs/day: 1.00    Years: 10.00    Pack years: 10.00    Types: Cigarettes  . Smokeless tobacco: Never Used  Substance Use Topics  . Alcohol use: Yes    Comment: weekends    Subjective:  2 day history of sore throat/ swollen tonsils ( left greater than right); was treated for similar symptoms in June 2019- did not finish abx prescription; took 2 left over Amoxicillin yesterday; does have  chronic recurrent tonsilitis- under care of ENT and is actually considering having tonsillectomy done later this fall; + painful to swallow; able to breathe/ does not feel that airway is blocked;  No known fever; wears CPAP- admits does not clean regularly as instructed.   Objective:  Vitals:   11/30/17 1027  BP: 130/90  Pulse: 91  Temp: 98.1 F (36.7 C)  TempSrc: Oral  SpO2: 98%  Weight: 193 lb (87.5 kg)  Height: 6\' 2"  (1.88 m)    General: Well developed, well nourished, in no acute distress  Skin : Warm and dry.  Head: Normocephalic and atraumatic  Eyes: Sclera and conjunctiva clear; pupils round and reactive to light; extraocular movements intact  Ears: External normal; canals clear; tympanic membranes normal  Oropharynx: Pink, supple. Erythematous, enlarged tonsils ( left greater than right); Neck: Supple without thyromegaly, + cervical adenopathy  Lungs: Respirations unlabored; clear to auscultation bilaterally without wheeze, rales, rhonchi  CVS exam: normal rate and regular rhythm.  Neurologic: Alert and oriented; speech intact; face symmetrical; moves all extremities well; CNII-XII intact without focal deficit  Assessment:  1. Sore throat     Plan:  Known history of recurrent tonsilitis; will give Depo-Medrol IM 40 mg today; start oral prednisone tomorrow; Rx for Augmentin 875 mg bid x 10 days; encouraged to clean CPAP as directed,  change toothbrush; keep planned follow-up with ENT as discussed.   No follow-ups on file.  No orders of the defined types were placed in this encounter.   Requested Prescriptions   Signed Prescriptions Disp Refills  . amoxicillin-clavulanate (AUGMENTIN) 875-125 MG tablet 20 tablet 0    Sig: Take 1 tablet by mouth 2 (two) times daily.  . predniSONE (DELTASONE) 20 MG tablet 10 tablet 0    Sig: Take 2 tablets (40 mg total) by mouth daily with breakfast.

## 2017-11-30 NOTE — Telephone Encounter (Signed)
Since the medication did not completely work the patient should come in for an OV for further evaluation.

## 2018-03-23 ENCOUNTER — Other Ambulatory Visit: Payer: Self-pay | Admitting: Internal Medicine

## 2018-03-23 DIAGNOSIS — I10 Essential (primary) hypertension: Secondary | ICD-10-CM

## 2018-03-23 MED ORDER — AMLODIPINE BESYLATE 10 MG PO TABS: 10.0000 mg | ORAL_TABLET | Freq: Every day | ORAL | 2 refills | Status: DC

## 2018-03-23 NOTE — Telephone Encounter (Signed)
Copied from CRM [id number]. Topic: Quick Communication - Rx Refill/Question >> Mar 23, 2018  4:06 PM Gaynelle AduPoole, Shalonda wrote: Medication: amLODipine (NORVASC) 10 MG tablet     Has the patient contacted their pharmacy? No   Preferred Pharmacy (with phone number or street name): [facility] [address] ([address]), [place] - [address] [phone] (Phone) [phone] (Fax)    Patient is calling to state he is completely out  the medication advise patient 24-72 hours. Stated he understood

## 2018-09-19 ENCOUNTER — Telehealth: Payer: Self-pay | Admitting: Internal Medicine

## 2018-09-19 DIAGNOSIS — I10 Essential (primary) hypertension: Secondary | ICD-10-CM

## 2018-09-19 MED ORDER — AMLODIPINE BESYLATE 10 MG PO TABS: 10.0000 mg | ORAL_TABLET | Freq: Every day | ORAL | 0 refills | Status: DC

## 2018-09-19 NOTE — Telephone Encounter (Signed)
Copied from CRM [id number]. Topic: Quick Communication - Rx Refill/Question >> Sep 19, 2018 11:03 AM Baldo Daub L wrote: Medication: amLODipine (NORVASC) 10 MG tablet  Has the patient contacted their pharmacy? Yes - needs new script (Agent: If no, request that the patient contact the pharmacy for the refill.) (Agent: If yes, when and what did the pharmacy advise?)  Preferred Pharmacy (with phone number or street name): [facility] [address] ([address] [place] - [address] [phone] (Phone) [phone] (Fax)  Agent: Please be advised that RX refills may take up to 3 business days. We ask that you follow-up with your pharmacy.

## 2018-10-24 ENCOUNTER — Telehealth: Payer: Self-pay | Admitting: Internal Medicine

## 2018-10-24 DIAGNOSIS — I10 Essential (primary) hypertension: Secondary | ICD-10-CM

## 2018-10-24 NOTE — Telephone Encounter (Signed)
Denied. Pt needs OV.

## 2018-10-24 NOTE — Telephone Encounter (Signed)
Medication Refill - Medication: amLODipine (NORVASC) 10 MG tablet  Has the patient contacted their pharmacy? Yes, pt has just moved and needs new script (Agent: If no, request that the patient contact the pharmacy for the refill.) (Agent: If yes, when and what did the pharmacy advise?)  Preferred Pharmacy (with phone number or street name):  [facility], [place] [phone] (Phone) [phone] (Fax)     Agent: Please be advised that RX refills may take up to 3 business days. We ask that you follow-up with your pharmacy.

## 2018-10-26 MED ORDER — AMLODIPINE BESYLATE 10 MG PO TABS: 10.0000 mg | ORAL_TABLET | Freq: Every day | ORAL | 2 refills | Status: DC

## 2018-10-26 NOTE — Telephone Encounter (Signed)
Refill sent.

## 2018-10-26 NOTE — Addendum Note (Signed)
Addended by: Juliet Rude on: 10/26/2018 12:47 PM   Modules accepted: Orders

## 2018-10-26 NOTE — Telephone Encounter (Signed)
Patient has made an appointment for Monday 6/29. He is out of medication and would like enough sent in until his appointment. [facility] file in [place].

## 2018-10-30 ENCOUNTER — Ambulatory Visit (INDEPENDENT_AMBULATORY_CARE_PROVIDER_SITE_OTHER): Payer: Self-pay | Admitting: Internal Medicine

## 2018-10-30 ENCOUNTER — Other Ambulatory Visit (INDEPENDENT_AMBULATORY_CARE_PROVIDER_SITE_OTHER): Payer: Self-pay

## 2018-10-30 ENCOUNTER — Other Ambulatory Visit: Payer: Self-pay

## 2018-10-30 ENCOUNTER — Encounter: Payer: Self-pay | Admitting: Internal Medicine

## 2018-10-30 VITALS — BP 144/88 | HR 101 | Temp 98.2°F | Resp 16 | Ht 74.0 in | Wt 185.0 lb

## 2018-10-30 DIAGNOSIS — R739 Hyperglycemia, unspecified: Secondary | ICD-10-CM

## 2018-10-30 DIAGNOSIS — Z0000 Encounter for general adult medical examination without abnormal findings: Secondary | ICD-10-CM

## 2018-10-30 DIAGNOSIS — I10 Essential (primary) hypertension: Secondary | ICD-10-CM

## 2018-10-30 DIAGNOSIS — F17200 Nicotine dependence, unspecified, uncomplicated: Secondary | ICD-10-CM

## 2018-10-30 LAB — CBC WITH DIFFERENTIAL/PLATELET
Basophils Absolute: 0.1 10*3/uL (ref 0.0–0.1)
Basophils Relative: 0.5 % (ref 0.0–3.0)
Eosinophils Absolute: 0.1 10*3/uL (ref 0.0–0.7)
Eosinophils Relative: 0.6 % (ref 0.0–5.0)
HCT: 43.7 % (ref 39.0–52.0)
Hemoglobin: 14.7 g/dL (ref 13.0–17.0)
Lymphocytes Relative: 20.3 % (ref 12.0–46.0)
Lymphs Abs: 2.2 10*3/uL (ref 0.7–4.0)
MCHC: 33.5 g/dL (ref 30.0–36.0)
MCV: 93.5 fl (ref 78.0–100.0)
Monocytes Absolute: 0.7 10*3/uL (ref 0.1–1.0)
Monocytes Relative: 6.2 % (ref 3.0–12.0)
Neutro Abs: 8 10*3/uL — ABNORMAL HIGH (ref 1.4–7.7)
Neutrophils Relative %: 72.4 % (ref 43.0–77.0)
Platelets: 358 10*3/uL (ref 150.0–400.0)
RBC: 4.68 Mil/uL (ref 4.22–5.81)
RDW: 14.4 % (ref 11.5–15.5)
WBC: 11 10*3/uL — ABNORMAL HIGH (ref 4.0–10.5)

## 2018-10-30 LAB — URINALYSIS, ROUTINE W REFLEX MICROSCOPIC
Bilirubin Urine: NEGATIVE
Hgb urine dipstick: NEGATIVE
Ketones, ur: NEGATIVE
Leukocytes,Ua: NEGATIVE
Nitrite: NEGATIVE
RBC / HPF: NONE SEEN (ref 0–?)
Specific Gravity, Urine: 1.02 (ref 1.000–1.030)
Total Protein, Urine: NEGATIVE
Urine Glucose: NEGATIVE
Urobilinogen, UA: 0.2 (ref 0.0–1.0)
WBC, UA: NONE SEEN (ref 0–?)
pH: 7 (ref 5.0–8.0)

## 2018-10-30 LAB — TSH: TSH: 0.76 u[IU]/mL (ref 0.35–4.50)

## 2018-10-30 LAB — PSA: PSA: 0.58 ng/mL (ref 0.10–4.00)

## 2018-10-30 LAB — HEMOGLOBIN A1C: Hgb A1c MFr Bld: 5.6 % (ref 4.6–6.5)

## 2018-10-30 MED ORDER — AMLODIPINE BESYLATE 10 MG PO TABS: 10.0000 mg | ORAL_TABLET | Freq: Every day | ORAL | 3 refills | Status: DC

## 2018-10-30 MED ORDER — LISINOPRIL 10 MG PO TABS: 10.0000 mg | ORAL_TABLET | Freq: Every day | ORAL | 3 refills | Status: DC

## 2018-10-30 NOTE — Patient Instructions (Signed)
Please take all new medication as prescribed - the lsinopril 10 mg per day  Please continue all other medications as before, and refills have been done if requested - the amlodipine  Please have the pharmacy call with any other refills you may need.  Please continue your efforts at being more active, low cholesterol diet, and weight control.  You are otherwise up to date with prevention measures today.  Please keep your appointments with your specialists as you may have planned  Please go to the LAB in the Basement (turn left off the elevator) for the tests to be done today  You will be contacted by phone if any changes need to be made immediately.  Otherwise, you will receive a letter about your results with an explanation, but please check with MyChart first.  Please remember to sign up for MyChart if you have not done so, as this will be important to you in the future with finding out test results, communicating by private email, and scheduling acute appointments online when needed.  Please return in 1 year for your yearly visit, or sooner if needed, with Lab testing done 3-5 days before

## 2018-10-30 NOTE — Progress Notes (Signed)
Subjective:    Patient ID: Eric Owens, male    DOB: 30-Jul-1975, 43 y.o.   MRN: [id number]  HPI . Here for wellness and f/u;  Overall doing ok;  Pt denies Chest pain, worsening SOB, DOE, wheezing, orthopnea, PND, worsening LE edema, palpitations, dizziness or syncope.  Pt denies neurological change such as new headache, facial or extremity weakness.  Pt denies polydipsia, polyuria, or low sugar symptoms. Pt states overall good compliance with treatment and medications, good tolerability, and has been trying to follow appropriate diet.  Pt denies worsening depressive symptoms, suicidal ideation or panic. No fever, night sweats, wt loss, loss of appetite, or other constitutional symptoms.  Pt states good ability with ADL's, has low fall risk, home safety reviewed and adequate, no other significant changes in hearing or vision, and only occasionally active with exercise. Good compliance with CPAP.  Also with 2 mo right lateral hip very mild discomfort worse to abduct the hip , but not really painful Past Medical History:  Diagnosis Date  . Heart murmur    Childhood  . HTN (hypertension)   . Kidney stones   . OSA (obstructive sleep apnea)    Past Surgical History:  Procedure Laterality Date  . FRACTURE SURGERY     Left Hand  . FRACTURE SURGERY     left hand    reports that he has been smoking cigarettes. He has a 10.00 pack-year smoking history. He has never used smokeless tobacco. He reports current alcohol use. He reports that he does not use drugs. family history includes Heart disease in his father and paternal grandfather; Hypertension in his father, sister, sister, and sister. No Known Allergies No current outpatient medications on file prior to visit.   No current facility-administered medications on file prior to visit.    Review of Systems Constitutional: Negative for other unusual diaphoresis, sweats, appetite or weight changes HENT: Negative for other worsening hearing loss, ear  pain, facial swelling, mouth sores or neck stiffness.   Eyes: Negative for other worsening pain, redness or other visual disturbance.  Respiratory: Negative for other stridor or swelling Cardiovascular: Negative for other palpitations or other chest pain  Gastrointestinal: Negative for worsening diarrhea or loose stools, blood in stool, distention or other pain Genitourinary: Negative for hematuria, flank pain or other change in urine volume.  Musculoskeletal: Negative for myalgias or other joint swelling.  Skin: Negative for other color change, or other wound or worsening drainage.  Neurological: Negative for other syncope or numbness. Hematological: Negative for other adenopathy or swelling Psychiatric/Behavioral: Negative for hallucinations, other worsening agitation, SI, self-injury, or new decreased concentration All other system neg per pt    Objective:   Physical Exam BP (!) 144/88   Pulse (!) 101   Temp 98.2 F (36.8 C) (Oral)   Resp 16   Ht 6\' 2"  (1.88 m)   Wt 185 lb (83.9 kg)   SpO2 98%   BMI 23.75 kg/m  VS noted,  Constitutional: Pt is oriented to person, place, and time. Appears well-developed and well-nourished, in no significant distress and comfortable Head: Normocephalic and atraumatic  Eyes: Conjunctivae and EOM are normal. Pupils are equal, round, and reactive to light Right Ear: External ear normal without discharge Left Ear: External ear normal without discharge Nose: Nose without discharge or deformity Mouth/Throat: Oropharynx is without other ulcerations and moist  Neck: Normal range of motion. Neck supple. No JVD present. No tracheal deviation present or significant neck LA or mass Cardiovascular: Normal  rate, regular rhythm, normal heart sounds and intact distal pulses.   Pulmonary/Chest: WOB normal and breath sounds without rales or wheezing  Abdominal: Soft. Bowel sounds are normal. NT. No HSM  Musculoskeletal: Normal range of motion. Exhibits no edema  Lymphadenopathy: Has no other cervical adenopathy.  Neurological: Pt is alert and oriented to person, place, and time. Pt has normal reflexes. No cranial nerve deficit. Motor grossly intact, Gait intact Skin: Skin is warm and dry. No rash noted or new ulcerations Psychiatric:  Has normal mood and affect. Behavior is normal without agitation No other exam findings Lab Results  Component Value Date   WBC 11.0 (H) 10/30/2018   HGB 14.7 10/30/2018   HCT 43.7 10/30/2018   PLT 358.0 10/30/2018   GLUCOSE 101 (H) 10/30/2018   CHOL 175 10/30/2018   TRIG 69.0 10/30/2018   HDL 55.90 10/30/2018   LDLCALC 105 (H) 10/30/2018   ALT 82 (H) 10/30/2018   AST 56 (H) 10/30/2018   NA 140 10/30/2018   K 3.5 10/30/2018   CL 103 10/30/2018   CREATININE 0.76 10/30/2018   BUN 9 10/30/2018   CO2 25 10/30/2018   TSH 0.76 10/30/2018   PSA 0.58 10/30/2018   INR 0.95 10/01/2015   HGBA1C 5.6 10/30/2018       Assessment & Plan:

## 2018-10-31 ENCOUNTER — Encounter: Payer: Self-pay | Admitting: Internal Medicine

## 2018-10-31 LAB — BASIC METABOLIC PANEL
BUN: 9 mg/dL (ref 6–23)
CO2: 25 mEq/L (ref 19–32)
Calcium: 9.4 mg/dL (ref 8.4–10.5)
Chloride: 103 mEq/L (ref 96–112)
Creatinine, Ser: 0.76 mg/dL (ref 0.40–1.50)
GFR: 136.92 mL/min (ref >60.00)
Glucose, Bld: 101 mg/dL — ABNORMAL HIGH (ref 70–99)
Potassium: 3.5 mEq/L (ref 3.5–5.1)
Sodium: 140 mEq/L (ref 135–145)

## 2018-10-31 LAB — LIPID PANEL
Cholesterol: 175 mg/dL (ref 0–200)
HDL: 55.9 mg/dL (ref >39.00)
LDL Cholesterol: 105 mg/dL — ABNORMAL HIGH (ref 0–99)
NonHDL: 119.09
Total CHOL/HDL Ratio: 3
Triglycerides: 69 mg/dL (ref 0.0–149.0)
VLDL: 13.8 mg/dL (ref 0.0–40.0)

## 2018-10-31 LAB — HEPATIC FUNCTION PANEL
ALT: 82 U/L — ABNORMAL HIGH (ref 0–53)
AST: 56 U/L — ABNORMAL HIGH (ref 0–37)
Albumin: 4.6 g/dL (ref 3.5–5.2)
Alkaline Phosphatase: 59 U/L (ref 39–117)
Bilirubin, Direct: 0.1 mg/dL (ref 0.0–0.3)
Total Bilirubin: 0.4 mg/dL (ref 0.2–1.2)
Total Protein: 7.6 g/dL (ref 6.0–8.3)

## 2018-10-31 NOTE — Assessment & Plan Note (Signed)
Urged to quit 

## 2018-10-31 NOTE — Assessment & Plan Note (Signed)
stable overall by history and exam, recent data reviewed with pt, and pt to continue medical treatment as before,  to f/u any worsening symptoms or concerns  

## 2018-10-31 NOTE — Assessment & Plan Note (Signed)

## 2018-11-01 ENCOUNTER — Encounter: Payer: Self-pay | Admitting: Internal Medicine

## 2019-11-24 ENCOUNTER — Other Ambulatory Visit: Payer: Self-pay | Admitting: Internal Medicine

## 2019-11-24 DIAGNOSIS — I10 Essential (primary) hypertension: Secondary | ICD-10-CM

## 2019-11-24 NOTE — Telephone Encounter (Signed)
Please refill as per office routine med refill policy (all routine meds refilled for 3 mo or monthly per pt preference up to one year from last visit, then month to month grace period for 3 mo, then further med refills will have to be denied)  

## 2019-11-26 NOTE — Telephone Encounter (Signed)
Erx sent

## 2019-11-26 NOTE — Telephone Encounter (Signed)
1.Medication Requested:amLODipine (NORVASC) 10 MG tablet  2. Pharmacy (Name, Street, [place]):[facility] [address] [place] - [address]  3. On Med List: Yes   4. Last Visit with PCP: 6.29.20   5. Next visit date with PCP: n/a   Agent: Please be advised that RX refills may take up to 3 business days. We ask that you follow-up with your pharmacy.

## 2020-01-09 ENCOUNTER — Telehealth: Payer: Self-pay | Admitting: Internal Medicine

## 2020-01-09 NOTE — Telephone Encounter (Signed)
    Patient calling to report he misplaced amLODipine (NORVASC) 10 MG tablet Pharmacy [facility] [address], [place] - [address] Last seen 6/20 Next appointment 02/08/20

## 2020-01-10 ENCOUNTER — Other Ambulatory Visit: Payer: Self-pay

## 2020-01-10 DIAGNOSIS — I10 Essential (primary) hypertension: Secondary | ICD-10-CM

## 2020-01-10 MED ORDER — AMLODIPINE BESYLATE 10 MG PO TABS: 10.0000 mg | ORAL_TABLET | Freq: Every day | ORAL | 3 refills | Status: DC

## 2020-02-08 ENCOUNTER — Ambulatory Visit (INDEPENDENT_AMBULATORY_CARE_PROVIDER_SITE_OTHER): Payer: 59 | Admitting: Internal Medicine

## 2020-02-08 ENCOUNTER — Encounter: Payer: Self-pay | Admitting: Internal Medicine

## 2020-02-08 ENCOUNTER — Other Ambulatory Visit: Payer: Self-pay

## 2020-02-08 VITALS — BP 130/90 | HR 89 | Temp 98.8°F | Ht 74.0 in | Wt 177.0 lb

## 2020-02-08 DIAGNOSIS — Z1159 Encounter for screening for other viral diseases: Secondary | ICD-10-CM | POA: Diagnosis not present

## 2020-02-08 DIAGNOSIS — Z0000 Encounter for general adult medical examination without abnormal findings: Secondary | ICD-10-CM

## 2020-02-08 DIAGNOSIS — Z7289 Other problems related to lifestyle: Secondary | ICD-10-CM

## 2020-02-08 DIAGNOSIS — F1090 Alcohol use, unspecified, uncomplicated: Secondary | ICD-10-CM

## 2020-02-08 DIAGNOSIS — E538 Deficiency of other specified B group vitamins: Secondary | ICD-10-CM

## 2020-02-08 DIAGNOSIS — R739 Hyperglycemia, unspecified: Secondary | ICD-10-CM

## 2020-02-08 DIAGNOSIS — Z789 Other specified health status: Secondary | ICD-10-CM

## 2020-02-08 DIAGNOSIS — E559 Vitamin D deficiency, unspecified: Secondary | ICD-10-CM

## 2020-02-08 DIAGNOSIS — I10 Essential (primary) hypertension: Secondary | ICD-10-CM | POA: Diagnosis not present

## 2020-02-08 LAB — URINALYSIS, ROUTINE W REFLEX MICROSCOPIC
Bilirubin Urine: NEGATIVE
Hgb urine dipstick: NEGATIVE
Ketones, ur: NEGATIVE
Leukocytes,Ua: NEGATIVE
Nitrite: NEGATIVE
RBC / HPF: NONE SEEN (ref 0–?)
Specific Gravity, Urine: 1.02 (ref 1.000–1.030)
Total Protein, Urine: NEGATIVE
Urine Glucose: NEGATIVE
Urobilinogen, UA: 0.2 (ref 0.0–1.0)
WBC, UA: NONE SEEN (ref 0–?)
pH: 7.5 (ref 5.0–8.0)

## 2020-02-08 LAB — CBC WITH DIFFERENTIAL/PLATELET
Basophils Absolute: 0.1 10*3/uL (ref 0.0–0.1)
Basophils Relative: 0.9 % (ref 0.0–3.0)
Eosinophils Absolute: 0.1 10*3/uL (ref 0.0–0.7)
Eosinophils Relative: 1.4 % (ref 0.0–5.0)
HCT: 45.9 % (ref 39.0–52.0)
Hemoglobin: 15.4 g/dL (ref 13.0–17.0)
Lymphocytes Relative: 35.5 % (ref 12.0–46.0)
Lymphs Abs: 2.6 10*3/uL (ref 0.7–4.0)
MCHC: 33.5 g/dL (ref 30.0–36.0)
MCV: 96.7 fl (ref 78.0–100.0)
Monocytes Absolute: 0.5 10*3/uL (ref 0.1–1.0)
Monocytes Relative: 7.3 % (ref 3.0–12.0)
Neutro Abs: 4 10*3/uL (ref 1.4–7.7)
Neutrophils Relative %: 54.9 % (ref 43.0–77.0)
Platelets: 311 10*3/uL (ref 150.0–400.0)
RBC: 4.74 Mil/uL (ref 4.22–5.81)
RDW: 13.9 % (ref 11.5–15.5)
WBC: 7.3 10*3/uL (ref 4.0–10.5)

## 2020-02-08 LAB — LIPID PANEL
Cholesterol: 177 mg/dL (ref 0–200)
HDL: 66.1 mg/dL (ref >39.00)
LDL Cholesterol: 102 mg/dL — ABNORMAL HIGH (ref 0–99)
NonHDL: 111.34
Total CHOL/HDL Ratio: 3
Triglycerides: 48 mg/dL (ref 0.0–149.0)
VLDL: 9.6 mg/dL (ref 0.0–40.0)

## 2020-02-08 LAB — BASIC METABOLIC PANEL
BUN: 10 mg/dL (ref 6–23)
CO2: 28 mEq/L (ref 19–32)
Calcium: 10.2 mg/dL (ref 8.4–10.5)
Chloride: 103 mEq/L (ref 96–112)
Creatinine, Ser: 0.82 mg/dL (ref 0.40–1.50)
GFR: 109.03 mL/min (ref >60.00)
Glucose, Bld: 88 mg/dL (ref 70–99)
Potassium: 4 mEq/L (ref 3.5–5.1)
Sodium: 140 mEq/L (ref 135–145)

## 2020-02-08 LAB — HEPATIC FUNCTION PANEL
ALT: 52 U/L (ref 0–53)
AST: 41 U/L — ABNORMAL HIGH (ref 0–37)
Albumin: 4.7 g/dL (ref 3.5–5.2)
Alkaline Phosphatase: 56 U/L (ref 39–117)
Bilirubin, Direct: 0.1 mg/dL (ref 0.0–0.3)
Total Bilirubin: 0.4 mg/dL (ref 0.2–1.2)
Total Protein: 7.9 g/dL (ref 6.0–8.3)

## 2020-02-08 LAB — TSH: TSH: 0.74 u[IU]/mL (ref 0.35–4.50)

## 2020-02-08 LAB — VITAMIN B12: Vitamin B-12: 314 pg/mL (ref 211–911)

## 2020-02-08 LAB — VITAMIN D 25 HYDROXY (VIT D DEFICIENCY, FRACTURES): VITD: 28.02 ng/mL — ABNORMAL LOW (ref 30.00–100.00)

## 2020-02-08 LAB — HEMOGLOBIN A1C: Hgb A1c MFr Bld: 5.7 % (ref 4.6–6.5)

## 2020-02-08 LAB — PSA: PSA: 0.78 ng/mL (ref 0.10–4.00)

## 2020-02-08 MED ORDER — AMLODIPINE BESYLATE 10 MG PO TABS: 10.0000 mg | ORAL_TABLET | Freq: Every day | ORAL | 3 refills | Status: DC

## 2020-02-08 NOTE — Patient Instructions (Signed)

## 2020-02-08 NOTE — Progress Notes (Signed)
Subjective:    Patient ID: Eric Owens, male    DOB: June 30, 1975, 44 y.o.   MRN: [id number]  HPI  Here for wellness and f/u;  Overall doing ok;  Pt denies Chest pain, worsening SOB, DOE, wheezing, orthopnea, PND, worsening LE edema, palpitations, dizziness or syncope.  Pt denies neurological change such as new headache, facial or extremity weakness.  Pt denies polydipsia, polyuria, or low sugar symptoms. Pt states overall good compliance with treatment and medications, good tolerability, and has been trying to follow appropriate diet.  Pt denies worsening depressive symptoms, suicidal ideation or panic. No fever, night sweats, wt loss, loss of appetite, or other constitutional symptoms.  Pt states good ability with ADL's, has low fall risk, home safety reviewed and adequate, no other significant changes in hearing or vision, and only occasionally active with exercise. BP at home ok, not taking the ace. Wt down intentionally with better diet.   Wt Readings from Last 3 Encounters:  02/08/20 177 lb (80.3 kg)  10/30/18 185 lb (83.9 kg)  11/30/17 193 lb (87.5 kg)   Past Medical History:  Diagnosis Date  . Heart murmur    Childhood  . HTN (hypertension)   . Kidney stones   . OSA (obstructive sleep apnea)    Past Surgical History:  Procedure Laterality Date  . FRACTURE SURGERY     Left Hand  . FRACTURE SURGERY     left hand    reports that he has been smoking cigarettes. He has a 10.00 pack-year smoking history. He has never used smokeless tobacco. He reports current alcohol use. He reports that he does not use drugs. family history includes Heart disease in his father and paternal grandfather; Hypertension in his father, sister, sister, and sister. No Known Allergies No current outpatient medications on file prior to visit.   No current facility-administered medications on file prior to visit.   Review of Systems All otherwise neg per pt    Objective:   Physical Exam BP 130/90 (BP  Location: Left Arm, Patient Position: Sitting, Cuff Size: Large)   Pulse 89   Temp 98.8 F (37.1 C) (Oral)   Ht 6\' 2"  (1.88 m)   Wt 177 lb (80.3 kg)   SpO2 97%   BMI 22.73 kg/m  VS noted,  Constitutional: Pt appears in NAD HENT: Head: NCAT.  Right Ear: External ear normal.  Left Ear: External ear normal.  Eyes: . Pupils are equal, round, and reactive to light. Conjunctivae and EOM are normal Nose: without d/c or deformity Neck: Neck supple. Gross normal ROM Cardiovascular: Normal rate and regular rhythm.   Pulmonary/Chest: Effort normal and breath sounds without rales or wheezing.  Abd:  Soft, NT, ND, + BS, no organomegaly Neurological: Pt is alert. At baseline orientation, motor grossly intact Skin: Skin is warm. No rashes, other new lesions, no LE edema Psychiatric: Pt behavior is normal without agitation  All otherwise neg per pt Lab Results  Component Value Date   WBC 11.0 (H) 10/30/2018   HGB 14.7 10/30/2018   HCT 43.7 10/30/2018   PLT 358.0 10/30/2018   GLUCOSE 101 (H) 10/30/2018   CHOL 175 10/30/2018   TRIG 69.0 10/30/2018   HDL 55.90 10/30/2018   LDLCALC 105 (H) 10/30/2018   ALT 82 (H) 10/30/2018   AST 56 (H) 10/30/2018   NA 140 10/30/2018   K 3.5 10/30/2018   CL 103 10/30/2018   CREATININE 0.76 10/30/2018   BUN 9 10/30/2018   CO2 25  10/30/2018   TSH 0.76 10/30/2018   PSA 0.58 10/30/2018   INR 0.95 10/01/2015   HGBA1C 5.6 10/30/2018      Assessment & Plan:

## 2020-02-09 ENCOUNTER — Encounter: Payer: Self-pay | Admitting: Internal Medicine

## 2020-02-09 DIAGNOSIS — F1090 Alcohol use, unspecified, uncomplicated: Secondary | ICD-10-CM | POA: Insufficient documentation

## 2020-02-09 DIAGNOSIS — Z789 Other specified health status: Secondary | ICD-10-CM | POA: Insufficient documentation

## 2020-02-09 DIAGNOSIS — Z7289 Other problems related to lifestyle: Secondary | ICD-10-CM | POA: Insufficient documentation

## 2020-02-09 NOTE — Assessment & Plan Note (Signed)
stable overall by history and exam, recent data reviewed with pt, and pt to continue medical treatment as before,  to f/u any worsening symptoms or concerns  

## 2020-02-09 NOTE — Assessment & Plan Note (Signed)
Admits to recent increased use, vague on amount, urged to quit, declines other tx

## 2020-02-09 NOTE — Assessment & Plan Note (Signed)

## 2020-02-11 LAB — HEPATITIS C ANTIBODY
Hepatitis C Ab: NONREACTIVE
SIGNAL TO CUT-OFF: 0 (ref <1.00)

## 2020-05-30 ENCOUNTER — Ambulatory Visit: Payer: 59 | Admitting: Internal Medicine

## 2020-05-30 ENCOUNTER — Telehealth: Payer: Self-pay | Admitting: Internal Medicine

## 2020-05-30 NOTE — Telephone Encounter (Signed)
I think under the circumstances, we can fill out the fmla as a one time extended leave issue, thanks

## 2020-05-30 NOTE — Telephone Encounter (Signed)
Mr. Eric Owens came to the office today to drop of FML paperwork. Patient states he was positive for covid on 1.11.22. His work is asking him to have FML paperwork to cover during that time.  Patient did an at home rapid test, so no test results for [place] to verify  Would you able to complete this paperwork?  I did tell the patient that without verification of positive covid, you may not be able to complete any paperwork.  Please advise.  Thanks.

## 2020-06-03 NOTE — Telephone Encounter (Signed)
VM box is full. Can transfer to [place].  Need to know first day out of work and what date he returned to work.   Forms have been completed and Placed in providers box to review and sign.  Will add dates once I speak with patient.

## 2020-06-04 DIAGNOSIS — Z0279 Encounter for issue of other medical certificate: Secondary | ICD-10-CM

## 2020-06-04 NOTE — Telephone Encounter (Signed)
Called patient again no answer VM box is full.  Unable to complete forms until patient returns call.

## 2020-06-04 NOTE — Telephone Encounter (Signed)
Spoke with patient, Start : 05-12-20 to 05-26-20  Forms have been faxed to [facility] @1800 [phone], Copy sent to scan &Charged for.   Patient informed and original mailed to patient for his records.

## 2021-02-16 ENCOUNTER — Other Ambulatory Visit: Payer: Self-pay | Admitting: Internal Medicine

## 2021-02-16 DIAGNOSIS — I10 Essential (primary) hypertension: Secondary | ICD-10-CM

## 2021-02-16 NOTE — Telephone Encounter (Signed)
Please refill as per office routine med refill policy (all routine meds to be refilled for 3 mo or monthly (per pt preference) up to one year from last visit, then month to month grace period for 3 mo, then further med refills will have to be denied) ? ?

## 2021-02-16 NOTE — Telephone Encounter (Signed)
Patient has appt scheduled for 02-27-2021  Patient is requesting short fill of amLODipine (NORVASC) 10 MG tablet  Pharamacy: [facility] [address], [place] - [address]  Please advise

## 2021-02-17 NOTE — Telephone Encounter (Signed)
Pt. Has called again and is requesting a short supply of amLODipine (NORVASC) 10 MG tablet   Pharamacy: [facility] [address] [place] - [address]   Callback #- [phone]

## 2021-02-18 NOTE — Telephone Encounter (Signed)
Patient called back to check on status of refill. States he is been out of it for 2 days now.

## 2021-02-18 NOTE — Telephone Encounter (Signed)
Patient calling to check status of short supply of amLODipine (NORVASC) 10 MG tablet  Patient states he has went 4 days w/o taking medication  Patient is concerned about getting sick

## 2021-02-19 NOTE — Telephone Encounter (Signed)
Patient calling to check status of short refill request.

## 2021-02-27 ENCOUNTER — Encounter: Payer: Self-pay | Admitting: Internal Medicine

## 2021-02-27 ENCOUNTER — Ambulatory Visit (INDEPENDENT_AMBULATORY_CARE_PROVIDER_SITE_OTHER): Payer: 59 | Admitting: Internal Medicine

## 2021-02-27 ENCOUNTER — Other Ambulatory Visit: Payer: Self-pay

## 2021-02-27 VITALS — BP 158/80 | HR 88 | Ht 74.0 in | Wt 171.0 lb

## 2021-02-27 DIAGNOSIS — I10 Essential (primary) hypertension: Secondary | ICD-10-CM

## 2021-02-27 DIAGNOSIS — E7800 Pure hypercholesterolemia, unspecified: Secondary | ICD-10-CM | POA: Diagnosis not present

## 2021-02-27 DIAGNOSIS — E559 Vitamin D deficiency, unspecified: Secondary | ICD-10-CM

## 2021-02-27 DIAGNOSIS — E785 Hyperlipidemia, unspecified: Secondary | ICD-10-CM | POA: Insufficient documentation

## 2021-02-27 DIAGNOSIS — J069 Acute upper respiratory infection, unspecified: Secondary | ICD-10-CM | POA: Diagnosis not present

## 2021-02-27 DIAGNOSIS — F17200 Nicotine dependence, unspecified, uncomplicated: Secondary | ICD-10-CM | POA: Diagnosis not present

## 2021-02-27 DIAGNOSIS — Z0001 Encounter for general adult medical examination with abnormal findings: Secondary | ICD-10-CM

## 2021-02-27 DIAGNOSIS — R739 Hyperglycemia, unspecified: Secondary | ICD-10-CM | POA: Diagnosis not present

## 2021-02-27 LAB — LIPID PANEL
Cholesterol: 180 mg/dL (ref 0–200)
HDL: 74.4 mg/dL (ref >39.00)
LDL Cholesterol: 95 mg/dL (ref 0–99)
NonHDL: 105.3
Total CHOL/HDL Ratio: 2
Triglycerides: 51 mg/dL (ref 0.0–149.0)
VLDL: 10.2 mg/dL (ref 0.0–40.0)

## 2021-02-27 LAB — URINALYSIS, ROUTINE W REFLEX MICROSCOPIC
Bilirubin Urine: NEGATIVE
Hgb urine dipstick: NEGATIVE
Ketones, ur: NEGATIVE
Leukocytes,Ua: NEGATIVE
Nitrite: NEGATIVE
Specific Gravity, Urine: 1.015 (ref 1.000–1.030)
Total Protein, Urine: NEGATIVE
Urine Glucose: NEGATIVE
Urobilinogen, UA: 0.2 (ref 0.0–1.0)
pH: 7 (ref 5.0–8.0)

## 2021-02-27 LAB — CBC WITH DIFFERENTIAL/PLATELET
Basophils Absolute: 0.1 10*3/uL (ref 0.0–0.1)
Basophils Relative: 0.8 % (ref 0.0–3.0)
Eosinophils Absolute: 0.1 10*3/uL (ref 0.0–0.7)
Eosinophils Relative: 0.6 % (ref 0.0–5.0)
HCT: 43.2 % (ref 39.0–52.0)
Hemoglobin: 14.3 g/dL (ref 13.0–17.0)
Lymphocytes Relative: 30.3 % (ref 12.0–46.0)
Lymphs Abs: 2.7 10*3/uL (ref 0.7–4.0)
MCHC: 33.1 g/dL (ref 30.0–36.0)
MCV: 97.9 fl (ref 78.0–100.0)
Monocytes Absolute: 0.7 10*3/uL (ref 0.1–1.0)
Monocytes Relative: 8.3 % (ref 3.0–12.0)
Neutro Abs: 5.3 10*3/uL (ref 1.4–7.7)
Neutrophils Relative %: 60 % (ref 43.0–77.0)
Platelets: 286 10*3/uL (ref 150.0–400.0)
RBC: 4.41 Mil/uL (ref 4.22–5.81)
RDW: 13.7 % (ref 11.5–15.5)
WBC: 8.8 10*3/uL (ref 4.0–10.5)

## 2021-02-27 LAB — BASIC METABOLIC PANEL
BUN: 10 mg/dL (ref 6–23)
CO2: 31 mEq/L (ref 19–32)
Calcium: 10.2 mg/dL (ref 8.4–10.5)
Chloride: 101 mEq/L (ref 96–112)
Creatinine, Ser: 0.73 mg/dL (ref 0.40–1.50)
GFR: 111.75 mL/min (ref >60.00)
Glucose, Bld: 88 mg/dL (ref 70–99)
Potassium: 3.4 mEq/L — ABNORMAL LOW (ref 3.5–5.1)
Sodium: 141 mEq/L (ref 135–145)

## 2021-02-27 LAB — HEPATIC FUNCTION PANEL
ALT: 52 U/L (ref 0–53)
AST: 45 U/L — ABNORMAL HIGH (ref 0–37)
Albumin: 4.7 g/dL (ref 3.5–5.2)
Alkaline Phosphatase: 52 U/L (ref 39–117)
Bilirubin, Direct: 0.1 mg/dL (ref 0.0–0.3)
Total Bilirubin: 0.4 mg/dL (ref 0.2–1.2)
Total Protein: 7.9 g/dL (ref 6.0–8.3)

## 2021-02-27 LAB — PSA: PSA: 0.9 ng/mL (ref 0.10–4.00)

## 2021-02-27 LAB — HEMOGLOBIN A1C: Hgb A1c MFr Bld: 5.2 % (ref 4.6–6.5)

## 2021-02-27 LAB — VITAMIN D 25 HYDROXY (VIT D DEFICIENCY, FRACTURES): VITD: 15.49 ng/mL — ABNORMAL LOW (ref 30.00–100.00)

## 2021-02-27 LAB — TSH: TSH: 1.7 u[IU]/mL (ref 0.35–5.50)

## 2021-02-27 MED ORDER — AZITHROMYCIN 250 MG PO TABS: ORAL_TABLET | ORAL | 0 refills | Status: AC

## 2021-02-27 MED ORDER — CHOLECALCIFEROL 50 MCG (2000 UT) PO TABS: ORAL_TABLET | ORAL | 99 refills | Status: AC

## 2021-02-27 MED ORDER — AMLODIPINE BESYLATE 10 MG PO TABS: 10.0000 mg | ORAL_TABLET | Freq: Every day | ORAL | 3 refills | Status: DC

## 2021-02-27 NOTE — Patient Instructions (Addendum)
Please stop smoking  Please take all new medication as prescribed - the antibiotic  Please take OTC Vitamin D3 at 2000 units per day, indefinitely  Please continue all other medications as before, and refills have been done if requested - the amlodipine  Please have the pharmacy call with any other refills you may need.  Please continue your efforts at being more active, low cholesterol diet, and weight control.  You are otherwise up to date with prevention measures today.  Please keep your appointments with your specialists as you may have planned  Please go to the LAB at the blood drawing area for the tests to be done  You will be contacted by phone if any changes need to be made immediately.  Otherwise, you will receive a letter about your results with an explanation, but please check with MyChart first.  Please remember to sign up for MyChart if you have not done so, as this will be important to you in the future with finding out test results, communicating by private email, and scheduling acute appointments online when needed.  Please make an Appointment to return for your 1 year visit, or sooner if needed

## 2021-02-27 NOTE — Progress Notes (Signed)
Patient ID: Eric Owens, male   DOB: 04-Mar-1976, 45 y.o.   MRN: [id number]         Chief Complaint:: wellness exam and Medication Refill , URI, elevated bp, smoker, hld, low vit d       HPI:  Eric Owens is a 45 y.o. male here for wellness exam; declines covid booster, flu shot, pneumovax, o/w up to date                        Also Here with 2-3 days acute onset fever, facial pain, pressure, headache, general weakness and malaise, and greenish d/c, with mild ST and cough, but pt denies chest pain, wheezing, increased sob or doe, orthopnea, PND, increased LE swelling, palpitations, dizziness or syncope.   Pt denies polydipsia, polyuria, or new focal neuro s/s.  Stll smoking, not ready to quit.  Not taking Vit D.  Has not really been working on low chol diet, but willing to start.  BP at home has been < 140/90 but not checked recently.   Pt denies fever, wt loss, night sweats, loss of appetite, or other constitutional symptoms  No other new complaints Wt Readings from Last 3 Encounters:  02/27/21 171 lb (77.6 kg)  02/08/20 177 lb (80.3 kg)  10/30/18 185 lb (83.9 kg)   BP Readings from Last 3 Encounters:  02/27/21 (!) 158/80  02/08/20 130/90  10/30/18 (!) 144/88   Immunization History  Administered Date(s) Administered   Moderna Sars-Covid-2 Vaccination 08/09/2019, 09/06/2019   Td 09/17/2014   There are no preventive care reminders to display for this patient.     Past Medical History:  Diagnosis Date   Heart murmur    Childhood   HTN (hypertension)    Kidney stones    OSA (obstructive sleep apnea)    Past Surgical History:  Procedure Laterality Date   FRACTURE SURGERY     Left Hand   FRACTURE SURGERY     left hand    reports that he has been smoking cigarettes. He has a 10.00 pack-year smoking history. He has never used smokeless tobacco. He reports current alcohol use. He reports that he does not use drugs. family history includes Heart disease in his father and paternal  grandfather; Hypertension in his father, sister, sister, and sister. No Known Allergies No current outpatient medications on file prior to visit.   No current facility-administered medications on file prior to visit.        ROS:  All others reviewed and negative.  Objective        PE:  BP (!) 158/80 (BP Location: Right Arm, Patient Position: Sitting, Cuff Size: Normal)   Pulse 88   Ht 6\' 2"  (1.88 m)   Wt 171 lb (77.6 kg)   SpO2 98%   BMI 21.96 kg/m                 Constitutional: Pt appears in NAD               HENT: Head: NCAT.                Right Ear: External ear normal.                 Left Ear: External ear normal.                Eyes: . Pupils are equal, round, and reactive to light. Conjunctivae and EOM are normal; Bilat tm's with mild  erythema.  Max sinus areas non ender.  Pharynx with mild erythema, no exudate               Nose: without d/c or deformity               Neck: Neck supple. Gross normal ROM but with bilateral submandibular LA mild tedner               Cardiovascular: Normal rate and regular rhythm.                 Pulmonary/Chest: Effort normal and breath sounds without rales or wheezing.                Abd:  Soft, NT, ND, + BS, no organomegaly               Neurological: Pt is alert. At baseline orientation, motor grossly intact               Skin: Skin is warm. No rashes, no other new lesions, LE edema - none               Psychiatric: Pt behavior is normal without agitation   Micro: none  Cardiac tracings I have personally interpreted today:  none  Pertinent Radiological findings (summarize): none   Lab Results  Component Value Date   WBC 8.8 02/27/2021   HGB 14.3 02/27/2021   HCT 43.2 02/27/2021   PLT 286.0 02/27/2021   GLUCOSE 88 02/27/2021   CHOL 180 02/27/2021   TRIG 51.0 02/27/2021   HDL 74.40 02/27/2021   LDLCALC 95 02/27/2021   ALT 52 02/27/2021   AST 45 (H) 02/27/2021   NA 141 02/27/2021   K 3.4 (L) 02/27/2021   CL 101 02/27/2021    CREATININE 0.73 02/27/2021   BUN 10 02/27/2021   CO2 31 02/27/2021   TSH 1.70 02/27/2021   PSA 0.90 02/27/2021   INR 0.95 10/01/2015   HGBA1C 5.2 02/27/2021   Assessment/Plan:  Eric Owens is a 45 y.o. Black or African American [2] male with  has a past medical history of Heart murmur, HTN (hypertension), Kidney stones, and OSA (obstructive sleep apnea).  Encounter for well adult exam with abnormal findings Age and sex appropriate education and counseling updated with regular exercise and diet Referrals for preventative services - none needed Immunizations addressed  - declines covid booster, flu and pneumovax Smoking counseling  - counseled to quit, pt not ready Evidence for depression or other mood disorder - none significant Most recent labs reviewed. I have personally reviewed and have noted: 1) the patient's medical and social history 2) The patient's current medications and supplements 3) The patient's height, weight, and BMI have been recorded in the chart   Essential hypertension BP Readings from Last 3 Encounters:  02/27/21 (!) 158/80  02/08/20 130/90  10/30/18 (!) 144/88   uncontrolled, pt to restart norvasc and cont to monitor at home, goal at least < 140/90   Smoker Pt counseled to quit, pt not ready  Hyperglycemia Lab Results  Component Value Date   HGBA1C 5.2 02/27/2021   Stable, pt to continue current medical treatment - diet   Vitamin D deficiency Last vitamin D Lab Results  Component Value Date   VD25OH 15.49 (L) 02/27/2021   Low, to start oral replacement   URI (upper respiratory infection) .Mild to mod, for antibx course,  to f/u any worsening symptoms or concerns  HLD (hyperlipidemia) Lab Results  Component Value Date  LDLCALC 95 02/27/2021   Stable, pt to continue current low chol diet  Followup: Return in about 1 year (around 02/27/2022).  Oliver Barre, MD 03/01/2021 8:16 AM [facility] -  [facility],

## 2021-03-01 ENCOUNTER — Encounter: Payer: Self-pay | Admitting: Internal Medicine

## 2021-03-01 NOTE — Assessment & Plan Note (Signed)
Pt counseled to quit, pt not ready 

## 2021-03-01 NOTE — Assessment & Plan Note (Signed)
Lab Results  Component Value Date   HGBA1C 5.2 02/27/2021   Stable, pt to continue current medical treatment - diet

## 2021-03-01 NOTE — Assessment & Plan Note (Signed)
Last vitamin D Lab Results  Component Value Date   VD25OH 15.49 (L) 02/27/2021   Low, to start oral replacement 

## 2021-03-01 NOTE — Assessment & Plan Note (Signed)
Age and sex appropriate education and counseling updated with regular exercise and diet Referrals for preventative services - none needed Immunizations addressed  - declines covid booster, flu and pneumovax Smoking counseling  - counseled to quit, pt not ready Evidence for depression or other mood disorder - none significant Most recent labs reviewed. I have personally reviewed and have noted: 1) the patient's medical and social history 2) The patient's current medications and supplements 3) The patient's height, weight, and BMI have been recorded in the chart

## 2021-03-01 NOTE — Assessment & Plan Note (Signed)
Lab Results  Component Value Date   LDLCALC 95 02/27/2021   Stable, pt to continue current low chol diet

## 2021-03-01 NOTE — Assessment & Plan Note (Signed)
BP Readings from Last 3 Encounters:  02/27/21 (!) 158/80  02/08/20 130/90  10/30/18 (!) 144/88   uncontrolled, pt to restart norvasc and cont to monitor at home, goal at least < 140/90

## 2021-03-01 NOTE — Assessment & Plan Note (Signed)
Mild to mod, for antibx course,  to f/u any worsening symptoms or concerns 

## 2022-06-01 ENCOUNTER — Telehealth: Payer: Self-pay | Admitting: Internal Medicine

## 2022-06-01 DIAGNOSIS — I10 Essential (primary) hypertension: Secondary | ICD-10-CM

## 2022-06-01 NOTE — Telephone Encounter (Signed)
Mailbox is full and is unable to leave a message at this time. Patient must keep appointment for further refills, LOV 01/2021

## 2022-06-01 NOTE — Telephone Encounter (Signed)
Unable to refill meds due to office med refill policy  Last seen oct 2022 - needs ROV

## 2022-06-01 NOTE — Telephone Encounter (Signed)
Caller & Relationship to patient: PT  Call back number: [phone]  Date of last office visit: 02/27/2021  Date of next office visit: 06/18/2022  Medication(s) to be refilled:  amLODipine (NORVASC) 10 MG tablet  Cholecalciferol 50 MCG (2000 UT) TABS   Preferred Pharmacy:   [place], [place]    PT only requesting partial refill prior to their upcoming appointment since it had been so long since they were in. PT still has their Cholecalciferol but is completely out of their amLODipine.

## 2022-06-02 MED ORDER — AMLODIPINE BESYLATE 10 MG PO TABS: 10.0000 mg | ORAL_TABLET | Freq: Every day | ORAL | 0 refills | Status: DC

## 2022-06-02 NOTE — Telephone Encounter (Signed)
Patient is scheduled for 06/18/22, is it possible to send in enough until his visit. Please advise

## 2022-06-02 NOTE — Telephone Encounter (Signed)
Ok this is done 

## 2022-06-02 NOTE — Telephone Encounter (Signed)
Patient called back and stated that he is completely out of his amLODipine (NORVASC) 10 MG tablet and he doesn't want to go without his blood pressure medication. He is requesting a partial supply until he comes in on 06/18/2022.  Best callback number is [phone].

## 2022-06-18 ENCOUNTER — Encounter: Payer: Self-pay | Admitting: Internal Medicine

## 2022-06-18 ENCOUNTER — Ambulatory Visit (INDEPENDENT_AMBULATORY_CARE_PROVIDER_SITE_OTHER): Payer: 59 | Admitting: Internal Medicine

## 2022-06-18 VITALS — BP 136/80 | HR 104 | Temp 98.1°F | Ht 74.0 in | Wt 162.0 lb

## 2022-06-18 DIAGNOSIS — E559 Vitamin D deficiency, unspecified: Secondary | ICD-10-CM

## 2022-06-18 DIAGNOSIS — I10 Essential (primary) hypertension: Secondary | ICD-10-CM

## 2022-06-18 DIAGNOSIS — R739 Hyperglycemia, unspecified: Secondary | ICD-10-CM

## 2022-06-18 DIAGNOSIS — E538 Deficiency of other specified B group vitamins: Secondary | ICD-10-CM

## 2022-06-18 DIAGNOSIS — Z125 Encounter for screening for malignant neoplasm of prostate: Secondary | ICD-10-CM

## 2022-06-18 DIAGNOSIS — Z0001 Encounter for general adult medical examination with abnormal findings: Secondary | ICD-10-CM

## 2022-06-18 DIAGNOSIS — E7800 Pure hypercholesterolemia, unspecified: Secondary | ICD-10-CM

## 2022-06-18 LAB — URINALYSIS, ROUTINE W REFLEX MICROSCOPIC
Bacteria, UA: NONE SEEN
Bilirubin Urine: NEGATIVE
Hgb urine dipstick: NEGATIVE
Ketones, ur: 40 — AB
Leukocytes,Ua: NEGATIVE
Specific Gravity, Urine: 1.025 (ref 1.000–1.030)
Total Protein, Urine: NEGATIVE
Urine Glucose: NEGATIVE
Urobilinogen, UA: 0.2 (ref 0.0–1.0)
pH: 6 (ref 5.0–8.0)

## 2022-06-18 LAB — HEPATIC FUNCTION PANEL
ALT: 70 U/L — ABNORMAL HIGH (ref 0–53)
AST: 69 U/L — ABNORMAL HIGH (ref 0–37)
Albumin: 4.7 g/dL (ref 3.5–5.2)
Alkaline Phosphatase: 52 U/L (ref 39–117)
Bilirubin, Direct: 0.1 mg/dL (ref 0.0–0.3)
Total Bilirubin: 0.5 mg/dL (ref 0.2–1.2)
Total Protein: 8 g/dL (ref 6.0–8.3)

## 2022-06-18 LAB — CBC WITH DIFFERENTIAL/PLATELET
Basophils Absolute: 0.1 10*3/uL (ref 0.0–0.1)
Basophils Relative: 0.9 % (ref 0.0–3.0)
Eosinophils Absolute: 0 10*3/uL (ref 0.0–0.7)
Eosinophils Relative: 0.3 % (ref 0.0–5.0)
HCT: 43.2 % (ref 39.0–52.0)
Hemoglobin: 14.5 g/dL (ref 13.0–17.0)
Lymphocytes Relative: 29.3 % (ref 12.0–46.0)
Lymphs Abs: 2.2 10*3/uL (ref 0.7–4.0)
MCHC: 33.5 g/dL (ref 30.0–36.0)
MCV: 97.1 fl (ref 78.0–100.0)
Monocytes Absolute: 0.6 10*3/uL (ref 0.1–1.0)
Monocytes Relative: 8.2 % (ref 3.0–12.0)
Neutro Abs: 4.6 10*3/uL (ref 1.4–7.7)
Neutrophils Relative %: 61.3 % (ref 43.0–77.0)
Platelets: 279 10*3/uL (ref 150.0–400.0)
RBC: 4.45 Mil/uL (ref 4.22–5.81)
RDW: 13.7 % (ref 11.5–15.5)
WBC: 7.6 10*3/uL (ref 4.0–10.5)

## 2022-06-18 LAB — HEMOGLOBIN A1C: Hgb A1c MFr Bld: 5.4 % (ref 4.6–6.5)

## 2022-06-18 LAB — LIPID PANEL
Cholesterol: 188 mg/dL (ref 0–200)
HDL: 79.5 mg/dL (ref >39.00)
LDL Cholesterol: 96 mg/dL (ref 0–99)
NonHDL: 108.55
Total CHOL/HDL Ratio: 2
Triglycerides: 63 mg/dL (ref 0.0–149.0)
VLDL: 12.6 mg/dL (ref 0.0–40.0)

## 2022-06-18 LAB — BASIC METABOLIC PANEL
BUN: 12 mg/dL (ref 6–23)
CO2: 27 mEq/L (ref 19–32)
Calcium: 10.1 mg/dL (ref 8.4–10.5)
Chloride: 101 mEq/L (ref 96–112)
Creatinine, Ser: 0.7 mg/dL (ref 0.40–1.50)
GFR: 112.14 mL/min (ref >60.00)
Glucose, Bld: 78 mg/dL (ref 70–99)
Potassium: 4 mEq/L (ref 3.5–5.1)
Sodium: 140 mEq/L (ref 135–145)

## 2022-06-18 LAB — TSH: TSH: 0.55 u[IU]/mL (ref 0.35–5.50)

## 2022-06-18 LAB — VITAMIN B12: Vitamin B-12: 362 pg/mL (ref 211–911)

## 2022-06-18 LAB — PSA: PSA: 0.93 ng/mL (ref 0.10–4.00)

## 2022-06-18 LAB — VITAMIN D 25 HYDROXY (VIT D DEFICIENCY, FRACTURES): VITD: 11.16 ng/mL — ABNORMAL LOW (ref 30.00–100.00)

## 2022-06-18 MED ORDER — AMLODIPINE BESYLATE 10 MG PO TABS: 10.0000 mg | ORAL_TABLET | Freq: Every day | ORAL | 3 refills | Status: DC

## 2022-06-18 NOTE — Patient Instructions (Addendum)
Please take OTC Vitamin D3 at 2000 units per day, indefinitely  Please continue all other medications as before, and refills have been done for the amlodipine  Please have the pharmacy call with any other refills you may need.  Please continue your efforts at being more active, low cholesterol diet, and weight control.  You are otherwise up to date with prevention measures today.  Please keep your appointments with your specialists as you may have planned  Please go to the LAB at the blood drawing area for the tests to be done  You will be contacted by phone if any changes need to be made immediately.  Otherwise, you will receive a letter about your results with an explanation, but please check with MyChart first.  Please make an Appointment to return for your 1 year visit, or sooner if needed

## 2022-06-18 NOTE — Progress Notes (Unsigned)
Patient ID: Haydar Merrett, male   DOB: [birth year], 47 y.o.   MRN: [id number]         Chief Complaint:: wellness exam and low vit d, htn, hld, hyperglycemia       HPI:  Abimael Dohrn is a 47 y.o. male here for wellness exam; declines covid booster, flu shot, o/w up to date                        Also Pt denies chest pain, increased sob or doe, wheezing, orthopnea, PND, increased LE swelling, palpitations, dizziness or syncope.   Pt denies polydipsia, polyuria, or new focal neuro s/s.    Pt denies fever, wt loss, night sweats, loss of appetite, or other constitutional symptoms     Wt Readings from Last 3 Encounters:  06/18/22 162 lb (73.5 kg)  02/27/21 171 lb (77.6 kg)  02/08/20 177 lb (80.3 kg)   BP Readings from Last 3 Encounters:  06/18/22 136/80  02/27/21 (!) 158/80  02/08/20 130/90   Immunization History  Administered Date(s) Administered   Moderna Sars-Covid-2 Vaccination 08/09/2019, 09/06/2019   Td 09/17/2014  There are no preventive care reminders to display for this patient.    Past Medical History:  Diagnosis Date   Heart murmur    Childhood   HTN (hypertension)    Kidney stones    OSA (obstructive sleep apnea)    Past Surgical History:  Procedure Laterality Date   FRACTURE SURGERY     Left Hand   FRACTURE SURGERY     left hand    reports that he has been smoking cigarettes. He has a 10.00 pack-year smoking history. He has never used smokeless tobacco. He reports current alcohol use. He reports that he does not use drugs. family history includes Heart disease in his father and paternal grandfather; Hypertension in his father, sister, sister, and sister. No Known Allergies Current Outpatient Medications on File Prior to Visit  Medication Sig Dispense Refill   Cholecalciferol 50 MCG (2000 UT) TABS 1 tab by mouth once daily (Patient not taking: Reported on 06/18/2022) 30 tablet 99   No current facility-administered medications on file prior to visit.        ROS:  All  others reviewed and negative.  Objective        PE:  BP 136/80 (BP Location: Left Arm, Patient Position: Sitting, Cuff Size: Large)   Pulse (!) 104   Temp 98.1 F (36.7 C) (Oral)   Ht 6' 2"$  (1.88 m)   Wt 162 lb (73.5 kg)   SpO2 96%   BMI 20.80 kg/m                 Constitutional: Pt appears in NAD               HENT: Head: NCAT.                Right Ear: External ear normal.                 Left Ear: External ear normal.                Eyes: . Pupils are equal, round, and reactive to light. Conjunctivae and EOM are normal               Nose: without d/c or deformity               Neck: Neck supple. Gross normal ROM  Cardiovascular: Normal rate and regular rhythm.                 Pulmonary/Chest: Effort normal and breath sounds without rales or wheezing.                Abd:  Soft, NT, ND, + BS, no organomegaly               Neurological: Pt is alert. At baseline orientation, motor grossly intact               Skin: Skin is warm. No rashes, no other new lesions, LE edema - none               Psychiatric: Pt behavior is normal without agitation   Micro: none  Cardiac tracings I have personally interpreted today:  none  Pertinent Radiological findings (summarize): none   Lab Results  Component Value Date   WBC 7.6 06/18/2022   HGB 14.5 06/18/2022   HCT 43.2 06/18/2022   PLT 279.0 06/18/2022   GLUCOSE 78 06/18/2022   CHOL 188 06/18/2022   TRIG 63.0 06/18/2022   HDL 79.50 06/18/2022   LDLCALC 96 06/18/2022   ALT 70 (H) 06/18/2022   AST 69 (H) 06/18/2022   NA 140 06/18/2022   K 4.0 06/18/2022   CL 101 06/18/2022   CREATININE 0.70 06/18/2022   BUN 12 06/18/2022   CO2 27 06/18/2022   TSH 0.55 06/18/2022   PSA 0.93 06/18/2022   INR 0.95 10/01/2015   HGBA1C 5.4 06/18/2022   Assessment/Plan:  Kiowa Maiorana is a 47 y.o. Black or African American [2] male with  has a past medical history of Heart murmur, HTN (hypertension), Kidney stones, and OSA (obstructive  sleep apnea).  Vitamin D deficiency Last vitamin D Lab Results  Component Value Date   VD25OH 15.49 (L) 02/27/2021   Low, to start oral replacement  Encounter for well adult exam with abnormal findings Age and sex appropriate education and counseling updated with regular exercise and diet Referrals for preventative services - none needed Immunizations addressed - declines flu shot, covid booster Smoking counseling  - none needed Evidence for depression or other mood disorder - none significant Most recent labs reviewed. I have personally reviewed and have noted: 1) the patient's medical and social history 2) The patient's current medications and supplements 3) The patient's height, weight, and BMI have been recorded in the chart   Essential hypertension BP Readings from Last 3 Encounters:  06/18/22 136/80  02/27/21 (!) 158/80  02/08/20 130/90   Stable, pt to continue medical treatment norvasc 10 mg qd  HLD (hyperlipidemia) Lab Results  Component Value Date   LDLCALC 96 06/18/2022   Stable, pt to continue low chol diet   Hyperglycemia Lab Results  Component Value Date   HGBA1C 5.4 06/18/2022   Stable, pt to continue current medical treatment  - diet, wt control Followup: Return in about 1 year (around 06/19/2023).  Cathlean Cower, MD 06/20/2022 6:02 AM [facility]

## 2022-06-18 NOTE — Assessment & Plan Note (Signed)
Last vitamin D Lab Results  Component Value Date   VD25OH 15.49 (L) 02/27/2021   Low, to start oral replacement

## 2022-06-20 ENCOUNTER — Encounter: Payer: Self-pay | Admitting: Internal Medicine

## 2022-06-20 NOTE — Assessment & Plan Note (Signed)
BP Readings from Last 3 Encounters:  06/18/22 136/80  02/27/21 (!) 158/80  02/08/20 130/90   Stable, pt to continue medical treatment norvasc 10 mg qd

## 2022-06-20 NOTE — Assessment & Plan Note (Signed)
Age and sex appropriate education and counseling updated with regular exercise and diet Referrals for preventative services - none needed Immunizations addressed - declines flu shot, covid booster Smoking counseling  - none needed Evidence for depression or other mood disorder - none significant Most recent labs reviewed. I have personally reviewed and have noted: 1) the patient's medical and social history 2) The patient's current medications and supplements 3) The patient's height, weight, and BMI have been recorded in the chart

## 2022-06-20 NOTE — Assessment & Plan Note (Signed)
Lab Results  Component Value Date   LDLCALC 96 06/18/2022   Stable, pt to continue low chol diet

## 2022-06-20 NOTE — Assessment & Plan Note (Signed)
Lab Results  Component Value Date   HGBA1C 5.4 06/18/2022   Stable, pt to continue current medical treatment  - diet, wt control

## 2022-06-21 NOTE — Progress Notes (Signed)
Letter printed and send

## 2022-06-28 ENCOUNTER — Telehealth: Payer: Self-pay | Admitting: Internal Medicine

## 2022-06-28 DIAGNOSIS — R7989 Other specified abnormal findings of blood chemistry: Secondary | ICD-10-CM

## 2022-06-28 NOTE — Telephone Encounter (Signed)
Patient received results in the mail and saw that the his AST and ALT were high. He is requesting an explanation regarding his liver, please advise if possible.

## 2022-06-28 NOTE — Telephone Encounter (Signed)
Patient would like a callback to discuss lab results, [phone].

## 2022-06-29 NOTE — Telephone Encounter (Signed)
The mild elevated LFTs have been present for several years without worsening, and is most likely related to "fatty liver"   - fat deposits in the liver, but I have ordered ultrasound to make sure.  Hopefully he will hear soon    thanks

## 2022-06-30 NOTE — Telephone Encounter (Signed)
Spoke with patient regarding lab results and informed him of ultrasound that was ordered and to expect a phone call to schedule

## 2023-01-19 ENCOUNTER — Telehealth: Payer: Self-pay | Admitting: Internal Medicine

## 2023-01-19 DIAGNOSIS — G4733 Obstructive sleep apnea (adult) (pediatric): Secondary | ICD-10-CM

## 2023-01-19 NOTE — Telephone Encounter (Signed)
Very sorry, no I dont treat sleep apnea, but I can refer to pulmonary to start the process for getting this done - just let me know, thanks

## 2023-01-19 NOTE — Telephone Encounter (Signed)
Patient called and said his C-Pap broke. He spoke with someone and was told he would need to get another sleep study to get a replacement. He would like to know if Dr. Jonny Ruiz can send a referral for an at-home sleep study. Best callback is [phone].

## 2023-01-20 NOTE — Telephone Encounter (Signed)
Ok this is done, thanks

## 2023-02-03 ENCOUNTER — Ambulatory Visit (INDEPENDENT_AMBULATORY_CARE_PROVIDER_SITE_OTHER): Payer: BC Managed Care – PPO | Admitting: Primary Care

## 2023-02-03 ENCOUNTER — Encounter: Payer: Self-pay | Admitting: Primary Care

## 2023-02-03 VITALS — BP 128/78 | HR 97 | Temp 98.5°F | Ht 74.0 in | Wt 159.2 lb

## 2023-02-03 DIAGNOSIS — R0683 Snoring: Secondary | ICD-10-CM | POA: Diagnosis not present

## 2023-02-03 DIAGNOSIS — G4733 Obstructive sleep apnea (adult) (pediatric): Secondary | ICD-10-CM

## 2023-02-03 NOTE — Patient Instructions (Signed)
  Sleep apnea is defined as period of 10 seconds or longer when you stop breathing at night. This can happen multiple times a night. Dx sleep apnea is when this occurs more than 5 times an hour.    Mild OSA 5-15 apneic events an hour Moderate OSA 15-30 apneic events an hour Severe OSA > 30 apneic events an hour   Untreated sleep apnea puts you at higher risk for cardiac arrhythmias, pulmonary HTN, stroke and diabetes  Treatment options include weight loss, side sleeping position, oral appliance, CPAP therapy or referral to ENT for possible surgical options    Recommendations: Focus on side sleeping position or elevate head with wedge pillow 30 degrees Work on weight loss efforts if able  Do not drive if experiencing excessive daytime sleepiness of fatigue    Orders: Home sleep study re: loud snoring (ordered)   Follow-up: Please call to schedule follow-up 1-2 weeks after completing home sleep study to review results and treatment if needed (can be virtual) 

## 2023-02-03 NOTE — Progress Notes (Signed)
@Patient  ID: Eric Owens, male    DOB: [birth year], 47 y.o.   MRN: [id number]  Chief Complaint  Patient presents with   Consult    Sleep Consult-snoring, stops breathing    Referring provider: Corwin Levins, MD  HPI: 47 year old male, current every day smoker. PMH significant for hypertension, OSA, hyperlipidemia, vitamin D deficiency, tobacco abuse.  02/03/2023 Patient presents today for sleep consult. He has a history of sleep apnea, he was on CPAP up until 2022. He was unable to get machine replaced, he stopped wearing PAP therapy 2 years ago. He continues to have symptoms of snoring, witnessed apnea and waking up gasping/choking.  Bedtime is between 9 and 10 PM.  It takes him on average 30 minutes to fall asleep.  He is unsure any times he wakes up throughout the night.  He starts his day at 5:30 AM.  He does not operate heavy machinery.  Weight is down 10 pounds.  He does not currently wear CPAP or wear oxygen. He is open to resume PAP therapy. Epworth 14. No concern for CLABSI, cataplexy or sleepwalking.   No Known Allergies  Immunization History  Administered Date(s) Administered   Moderna Sars-Covid-2 Vaccination 08/09/2019, 09/06/2019   Td 09/17/2014    Past Medical History:  Diagnosis Date   Heart murmur    Childhood   HTN (hypertension)    Kidney stones    OSA (obstructive sleep apnea)     Tobacco History: Social History   Tobacco Use  Smoking Status Every Day   Current packs/day: 1.00   Average packs/day: 1 pack/day for 10.0 years (10.0 ttl pk-yrs)   Types: Cigarettes  Smokeless Tobacco Never   Ready to quit: Not Answered Counseling given: Not Answered   Outpatient Medications Prior to Visit  Medication Sig Dispense Refill   amLODipine (NORVASC) 10 MG tablet Take 1 tablet (10 mg total) by mouth daily. 90 tablet 3   Cholecalciferol 50 MCG (2000 UT) TABS 1 tab by mouth once daily (Patient not taking: Reported on 06/18/2022) 30 tablet 99   No  facility-administered medications prior to visit.    Review of Systems  Review of Systems  Constitutional:  Positive for fatigue.  HENT: Negative.    Respiratory: Negative.    Cardiovascular: Negative.   Psychiatric/Behavioral:  Positive for sleep disturbance.      Physical Exam  BP 128/78 (BP Location: Left Arm, Cuff Size: Normal)   Pulse 97   Temp 98.5 F (36.9 C) (Temporal)   Ht 6\' 2"  (1.88 m)   Wt 159 lb 3.2 oz (72.2 kg)   SpO2 99%   BMI 20.44 kg/m  Physical Exam Constitutional:      Appearance: Normal appearance.  HENT:     Head: Normocephalic and atraumatic.  Cardiovascular:     Rate and Rhythm: Normal rate.  Pulmonary:     Effort: Pulmonary effort is normal.  Musculoskeletal:        General: Normal range of motion.  Skin:    General: Skin is warm and dry.  Neurological:     General: No focal deficit present.     Mental Status: He is alert and oriented to person, place, and time. Mental status is at baseline.  Psychiatric:        Mood and Affect: Mood normal.        Behavior: Behavior normal.        Thought Content: Thought content normal.        Judgment: Judgment normal.  Lab Results:  CBC    Component Value Date/Time   WBC 7.6 06/18/2022 1142   RBC 4.45 06/18/2022 1142   HGB 14.5 06/18/2022 1142   HCT 43.2 06/18/2022 1142   PLT 279.0 06/18/2022 1142   MCV 97.1 06/18/2022 1142   MCV 88.3 04/22/2016 1545   MCH 30.5 11/22/2017 1712   MCHC 33.5 06/18/2022 1142   RDW 13.7 06/18/2022 1142   LYMPHSABS 2.2 06/18/2022 1142   MONOABS 0.6 06/18/2022 1142   EOSABS 0.0 06/18/2022 1142   BASOSABS 0.1 06/18/2022 1142    BMET    Component Value Date/Time   NA 140 06/18/2022 1142   NA 137 04/22/2016 1538   K 4.0 06/18/2022 1142   CL 101 06/18/2022 1142   CO2 27 06/18/2022 1142   GLUCOSE 78 06/18/2022 1142   BUN 12 06/18/2022 1142   BUN 13 04/22/2016 1538   CREATININE 0.70 06/18/2022 1142   CALCIUM 10.1 06/18/2022 1142   GFRNONAA >60  11/22/2017 1712   GFRAA >60 11/22/2017 1712    BNP No results found for: "BNP"  ProBNP No results found for: "PROBNP"  Imaging: No results found.   Assessment & Plan:   Hx sleep apnea/loud snoring -Patient has a history of sleep apnea, currently not on CPAP.  He continues to have symptoms of snoring, waking up gasping for air and witnessed apnea.  Needs repeat home sleep study to reestablish diagnosis due to break in therapy.  If home sleep study confirms diagnosis of OSA we will proceed with order for replacement CPAP machine with supplies.  We reviewed risks of untreated sleep apnea including cardiac arrhythmias, pulmonary hypertension, diabetes and stroke.  We also briefly discussed alternative treatment options including weight loss, oral appliance or referral to ENT for possible surgical options.  Advised patient follow-up with office 2 to 4 weeks after completing sleep study to review results and treatment options if needed.   Glenford Bayley, NP 02/03/2023

## 2023-06-06 ENCOUNTER — Telehealth: Payer: Self-pay | Admitting: Primary Care

## 2023-06-06 NOTE — Telephone Encounter (Signed)
Amy can you ask patient whether or not he still wants sleep testing done, if so needs to call the number provided above. May take 2-3 weeks to get results back. Call once he has had testing for follow-up

## 2023-06-06 NOTE — Telephone Encounter (Signed)
I spoke with Victorino Dike from [facility] - it looks like pt delayed testing.  Pt has not completed a HST for our office.  He just needs to call [facility] at [phone] to complete registration to complete HST.

## 2023-06-06 NOTE — Telephone Encounter (Signed)
Attempted to call patient to ask if he still wants sleep test done.  VM box full and cannot receive messages.

## 2023-06-06 NOTE — Telephone Encounter (Signed)
Has patient had sleep study yet?

## 2023-06-17 NOTE — Telephone Encounter (Signed)
Amy do you have any follow up on this?   Thanks,  [employer]

## 2023-06-20 NOTE — Telephone Encounter (Signed)
Patient would like to schedule sleep test. Patient phone number is [phone].

## 2023-06-21 NOTE — Telephone Encounter (Signed)
The patient needs to call [facility] [phone]

## 2023-06-21 NOTE — Telephone Encounter (Signed)
Called patient.  Gave all information.  Patient verbalized understanding.

## 2023-07-18 ENCOUNTER — Ambulatory Visit

## 2023-07-18 DIAGNOSIS — G4733 Obstructive sleep apnea (adult) (pediatric): Secondary | ICD-10-CM

## 2023-07-21 ENCOUNTER — Other Ambulatory Visit: Payer: Self-pay | Admitting: Internal Medicine

## 2023-07-21 DIAGNOSIS — I10 Essential (primary) hypertension: Secondary | ICD-10-CM

## 2023-07-22 ENCOUNTER — Other Ambulatory Visit: Payer: Self-pay

## 2023-08-10 DIAGNOSIS — G4733 Obstructive sleep apnea (adult) (pediatric): Secondary | ICD-10-CM | POA: Diagnosis not present

## 2023-08-15 NOTE — Progress Notes (Addendum)
 Hst 07/18/23 showed moderate OSA, please schedule virtual visit to review results and treatment first available   See phone message for note. Note closed per admin protocol

## 2023-08-16 ENCOUNTER — Telehealth: Payer: Self-pay

## 2023-08-16 NOTE — Telephone Encounter (Signed)
-----   Message from Antonio Baumgarten sent at 08/15/2023 10:24 AM EDT ----- Hst 07/18/23 showed moderate OSA, please schedule virtual visit to review results and treatment first available

## 2023-08-16 NOTE — Telephone Encounter (Signed)
 Called and informed pt and scheduled their next appt.NFN

## 2023-09-09 ENCOUNTER — Telehealth: Payer: Self-pay | Admitting: Primary Care

## 2023-09-09 ENCOUNTER — Telehealth: Admitting: Primary Care

## 2023-09-09 DIAGNOSIS — G4733 Obstructive sleep apnea (adult) (pediatric): Secondary | ICD-10-CM | POA: Diagnosis not present

## 2023-09-09 NOTE — Progress Notes (Signed)
 Virtual Visit via Video Note  I connected with Eric Owens on 09/09/23 at  3:30 PM EDT by a video enabled telemedicine application and verified that I am speaking with the correct person using two identifiers.  Location: Patient: Home Provider: Office   I discussed the limitations of evaluation and management by telemedicine and the availability of in person appointments. The patient expressed understanding and agreed to proceed.  History of Present Illness:  48 year old male, current every day smoker. PMH significant for hypertension, OSA, hyperlipidemia, vitamin D  deficiency, tobacco abuse.  Previous [facility] pulmonary encounter:  02/03/2023 Patient presents today for sleep consult. He has a history of sleep apnea, he was on CPAP up until 2022. He was unable to get machine replaced, he stopped wearing PAP therapy 2 years ago. He continues to have symptoms of snoring, witnessed apnea and waking up gasping/choking.  Bedtime is between 9 and 10 PM.  It takes him on average 30 minutes to fall asleep.  He is unsure any times he wakes up throughout the night.  He starts his day at 5:30 AM.  He does not operate heavy machinery.  Weight is down 10 pounds.  He does not currently wear CPAP or wear oxygen. He is open to resume PAP therapy. Epworth 14. No concern for CLABSI, cataplexy or sleepwalking.   09/09/2023- INTERIM Discussed the use of AI scribe software for clinical note transcription with the patient, who gave verbal consent to proceed.  History of Present Illness   Eric Owens is a 48 year old male with moderate obstructive sleep apnea who presents for CPAP machine replacement.  He has a history of moderate obstructive sleep apnea, confirmed by a sleep study in March showing 15.7 apneic events per hour. His symptoms include loud snoring, waking up gasping for air, and witnessed apneas. He previously used a CPAP machine, but discontinued its use due to a malfunctioning humidifier.  He notes a  significant decrease in daily energy levels when not using the CPAP machine, stating he does not wake up with the same energy as when he uses it. His typical bedtime is between 9 and 10 PM, and he wakes up at 5:30 AM, taking no more than 30 minutes to fall asleep.  He has used a CPAP machine for over ten years, but the humidifier malfunctioned, causing nasal dryness and discomfort, necessitating its removal. He does not recall the previous supplier of his CPAP machine.      Observations/Objective:  Appears well without overt respiratory symptoms; Able to speak in full sentences   Sleep testing: HST 07/18/23 showed moderate OSA, AHI 15.7 with SpO2 low 81%   Assessment and Plan:  1. Moderate obstructive sleep apnea (Primary)  Assessment and Plan    Obstructive Sleep Apnea Moderate obstructive sleep apnea confirmed with 15.7 apneic events per hour from March home sleep study. Symptoms include loud snoring, nocturnal gasping, and witnessed apnea. Reports decreased energy and daytime fatigue without CPAP use. Previous CPAP humidifier malfunctioned, necessitating replacement. CPAP indicated for moderate sleep apnea, and he is amenable to replacing the CPAP machine due to improved energy levels with use. - Order new CPAP machine, auto settings 5-15cm h20 - Schedule compliance check-in 6-8 weeks post-CPAP initiation - Instruct on CPAP maintenance: change nasal mask cushions biweekly, full face mask cushions monthly, CPAP filter monthly, mask frame and tubing quarterly, headgear and water chamber biannually - Use distilled water in CPAP humidifier - Advised patient aim to wear CPAP nightly 4-6 hours or longer  Follow Up Instructions:  31-90 days for CPAP compliance    I discussed the assessment and treatment plan with the patient. The patient was provided an opportunity to ask questions and all were answered. The patient agreed with the plan and demonstrated an understanding of the  instructions.   The patient was advised to call back or seek an in-person evaluation if the symptoms worsen or if the condition fails to improve as anticipated.  I provided 22 minutes of non-face-to-face time during this encounter.   Antonio Baumgarten, NP

## 2023-09-09 NOTE — Patient Instructions (Signed)
 YOUR PLAN: -OBSTRUCTIVE SLEEP APNEA: Obstructive sleep apnea is a condition where your airway becomes blocked during sleep, causing breathing pauses. This can lead to symptoms like loud snoring, gasping for air, and daytime fatigue. We will order a new CPAP machine for you to help manage your symptoms. Please follow the maintenance instructions: change nasal mask cushions every two weeks, full face mask cushions monthly, CPAP filter monthly, mask frame and tubing every three months, and headgear and water chamber every six months. Use distilled water in the CPAP humidifier.  INSTRUCTIONS: We will schedule a compliance check-in 6-8 weeks after you start using the new CPAP machine to ensure everything is working well for you.  CPAP and BIPAP Information CPAP and BIPAP use air pressure to keep your airways open and help you breathe well. CPAP and BIPAP use different amounts of pressure. Your health care provider will tell you whether CPAP or BIPAP would be best for you. CPAP stands for continuous positive airway pressure. With CPAP, the amount of pressure stays the same while you breathe in and out. BIPAP stands for bi-level positive airway pressure. With BIPAP, the amount of pressure will be higher when you breathe in and lower when you breathe out. This allows you to take bigger breaths. CPAP or BIPAP may be used in the hospital or at home. You may need to have a sleep study before your provider can order a device for you to use at home. What are the advantages? CPAP and BIPAP are most often used for obstructive sleep apnea to keep the airways from collapsing when the muscles relax during sleep. CPAP or BIPAP can be used if you have: Chronic obstructive pulmonary disease. Heart failure. Medical conditions that cause muscle weakness. Other problems that cause breathing to be shallow, weak, or difficult. What are the risks? Your provider will talk with you about risks. These may include: Sores on  your nose or face caused from the mask, prongs, or nasal pillows. Dry or stuffy nose or nosebleeds. Feeling gassy or bloated. Sinus or lung infection if the equipment is not cleaned well. When should CPAP or BIPAP be used? In most cases, CPAP or BIPAP is used during sleep at night or whenever the main sleep time happens. It's also used during naps. People with some medical conditions may need to wear the mask when they're awake. Follow instructions from your provider about when to use your CPAP or BIPAP. What happens during CPAP or BIPAP?  Both CPAP and BIPAP use a small machine that uses electricity to create air pressure. A long tube connects the device to a plastic mask. Air is blown through the mask into your nose or mouth. The amount of pressure that's used to blow the air can be adjusted. Your provider will set the pressure setting and help you find the best mask for you. Tips for using the mask There are different types and sizes of masks. If your mask does not fit well, talk with your provider about getting a different one. Some common types of masks include: Full face masks, which fit over the mouth and nose. Nasal masks, which fit over the nose. Nasal pillow or prong masks, which fit into the nostrils. The mask needs to be snug to your face, so some people feel trapped or closed in at first. If you feel this way, you may need to get used to the mask. Hold the mask loosely over your nose or mouth and then gradually put the the  mask on more snugly. Slowly increase the amount of time you use the mask. If you have trouble with your mask not fitting well or leaking, talk with your provider. Do not stop using the mask. Tips for using the device Follow instructions from your provider about how to and how often to use the device. For home use, CPAP and BIPAP devices come from home health care companies. There are many different brands. Your health insurance company will help to decide which  device you get. Keep the CPAP or BIPAP device and attachments clean. Ask your home health care company or check the instruction book for cleaning instructions. Make sure the humidifier is filled with germ-free (sterile) water and is working correctly. This will help prevent a dry or stuffy nose or nosebleeds. A nasal saline mist or spray may keep your nose from getting dry and sore. Do not eat or drink while the CPAP or BIPAP device is on. Food or drinks could get pushed into your lungs by the pressure of the CPAP or BIPAP. Follow these instructions at home: Take over-the-counter and prescription medicines only as told by your provider. Do not smoke, vape, or use nicotine or tobacco. Contact a health care provider if: You have redness or pressure sores on your head, face, mouth, or nose from the mask or headgear. You have trouble using the CPAP or BIPAP device. You have trouble going to sleep or staying asleep. Someone tells you that you snore even when wearing your CPAP or BIPAP device. Get help right away if: You have trouble breathing. You feel confused. These symptoms may be an emergency. Get help right away. Call 911. Do not wait to see if the symptoms will go away. Do not drive yourself to the hospital. This information is not intended to replace advice given to you by your health care provider. Make sure you discuss any questions you have with your health care provider. Document Revised: 08/11/2022 Document Reviewed: 08/11/2022 Elsevier Patient Education  2024 [employer].

## 2023-09-09 NOTE — Telephone Encounter (Signed)
 Patient needs follow-up for CPAP compliance with Beth NP in 6-8 weeks, virtual visit ok on Friday afternoon

## 2023-09-19 ENCOUNTER — Telehealth: Payer: Self-pay | Admitting: Primary Care

## 2023-09-19 NOTE — Telephone Encounter (Signed)
 CMN received from [facility] [employer] for cpap supplies.

## 2023-09-20 NOTE — Telephone Encounter (Signed)
 Rc'd signed MRN . Will fax to [phone] and attach confirmation.

## 2023-11-17 ENCOUNTER — Telehealth: Payer: Self-pay

## 2023-11-17 NOTE — Telephone Encounter (Signed)
 Pt is scheduled to see Landry Ferrari, NP tomorrow virtually for cpap f/u. I was unable to find pt in [facility]. ATC pt X1. Unable to lvm as mailbox is full. I am not sure if pt is using his CPAP or not. I will try his DME company and see if the have any data.   I called [employer] and could not reach Brad. I called Mitch and he stated if I send him an email, he will look into this for me. I will send him the email and wait for a response back.

## 2023-11-18 ENCOUNTER — Encounter: Admitting: Primary Care

## 2023-11-18 ENCOUNTER — Ambulatory Visit: Admitting: Primary Care

## 2023-11-18 NOTE — Progress Notes (Signed)
 Err

## 2023-11-18 NOTE — Telephone Encounter (Signed)
 Attempted to contact patient for virtual visit today, no answer and could not leave voice mail. Needs to reschedule visit.

## 2023-11-21 NOTE — Telephone Encounter (Signed)
 I was able to reach Eric Owens. He states every time he trys to use his CPAP it won't take his card . He is not sure why. I think he means he was trying to call in to [employer] with his Credit Card and they can not get it to take. He states he has not used his CPAP at all. [name]

## 2023-12-06 NOTE — Progress Notes (Signed)
 This encounter was created in error - please disregard.

## 2023-12-08 NOTE — Telephone Encounter (Signed)
 Beth, what do you want to do moving forward for pt?

## 2023-12-09 NOTE — Telephone Encounter (Signed)
 He had a home sleep study in March that showed moderate OSA, we placed an order for new machine in May. Not sure what he actually needs but if it is a payment issue that needs to be addressed by [facility] not us . Happy to send an order to a  different DME company

## 2023-12-13 NOTE — Telephone Encounter (Signed)
 I have sent a community msg to [employer] and will await their response.

## 2023-12-14 NOTE — Telephone Encounter (Signed)
 Still awaiting response from [employer] 12/14/23

## 2023-12-19 NOTE — Telephone Encounter (Signed)
 Received the following from [employer]:  New, Eric Owens, Eric Owens; Eric Owens,  Per notes, we last spoke to patient on 10-24-2023. The order was not competed due to patient not being able to setup required credit card on file for cpap rental. He said he would call back once he received new credit card, but we never received a call back and order was voided.  If patient is ready to move forward with setup his credit card and having setup I can re-pull the order for re-review. Just let me know.  Thank you,  Brad New       Sent the pt msg via mychart
